# Patient Record
Sex: Female | Born: 1990
Health system: Southern US, Community
[De-identification: ages and names within clinical notes are randomized; demographics above are authoritative.]

## PROBLEM LIST (undated history)

## (undated) DIAGNOSIS — D649 Anemia, unspecified: Secondary | ICD-10-CM

## (undated) HISTORY — PX: NO PAST SURGERIES: SHX2092

---

## 2016-09-09 DIAGNOSIS — B009 Herpesviral infection, unspecified: Secondary | ICD-10-CM

## 2016-09-09 HISTORY — DX: Herpesviral infection, unspecified: B00.9

## 2017-09-09 DIAGNOSIS — G971 Other reaction to spinal and lumbar puncture: Secondary | ICD-10-CM

## 2017-09-09 HISTORY — DX: Other reaction to spinal and lumbar puncture: G97.1

## 2019-05-30 ENCOUNTER — Encounter: Payer: Self-pay | Admitting: Emergency Medicine

## 2019-05-30 ENCOUNTER — Emergency Department: Payer: HRSA Program

## 2019-05-30 ENCOUNTER — Emergency Department
Admission: EM | Admit: 2019-05-30 | Discharge: 2019-05-30 | Disposition: A | Discharge status: Home / Self Care | Payer: HRSA Program | Attending: Emergency Medicine | Admitting: Emergency Medicine

## 2019-05-30 ENCOUNTER — Other Ambulatory Visit: Payer: Self-pay

## 2019-05-30 DIAGNOSIS — Z20822 Contact with and (suspected) exposure to covid-19: Secondary | ICD-10-CM

## 2019-05-30 DIAGNOSIS — U071 COVID-19: Secondary | ICD-10-CM | POA: Insufficient documentation

## 2019-05-30 DIAGNOSIS — Z87891 Personal history of nicotine dependence: Secondary | ICD-10-CM | POA: Diagnosis not present

## 2019-05-30 DIAGNOSIS — R0602 Shortness of breath: Secondary | ICD-10-CM | POA: Diagnosis present

## 2019-05-30 MED ORDER — BENZONATATE 100 MG PO CAPS: 100.0000 mg | ORAL_CAPSULE | Freq: Three times a day (TID) | ORAL | 0 refills | Status: DC | PRN

## 2019-05-30 NOTE — ED Notes (Signed)
See triage note  presents with sore throat and diff breathing  States she was here with her daughter last Thursday    She had fever on Friday but is afebrile on arrival  States her daughter tested positive for COVID

## 2019-05-30 NOTE — ED Triage Notes (Signed)
C/O difficulty breathing, sore throat since Thursday.  Daughter tested positive for COVID on Thursday.   AAOx3.  Skin warm and dry. NAD

## 2019-05-30 NOTE — ED Provider Notes (Signed)
[facility] Provider Note  ____________________________________________  Time seen: Approximately 5:45 PM  I have reviewed the triage vital signs and the nursing notes.   HISTORY  Chief Complaint Shortness of Breath    HPI Jodi [facility] is a 28 y.o. female that presents to the emergency department for evaluation of shortness of breath, cough, fever and exposure to COVID 19.  Patient states that daughter tested positive for COVID-19.  Patient states that she heard on the news that you should go to the emergency department if you feel short of breath so she came today to be evaluated.  Patient does not smoke.  No history of allergies or asthma.  No vomiting, diarrhea.   History reviewed. No pertinent past medical history.  There are no active problems to display for this patient.   History reviewed. No pertinent surgical history.  Prior to Admission medications   Medication Sig Start Date End Date Taking? Authorizing Provider  benzonatate (TESSALON PERLES) 100 MG capsule Take 1 capsule (100 mg total) by mouth 3 (three) times daily as needed. 05/30/19 05/29/20  Laban Emperor, PA-C    Allergies Dimetapp multisymptom cold-flu [phenyleph-cpm-dm-apap]  No family history on file.  Social History Social History   Tobacco Use  . Smoking status: Former Research scientist (life sciences)  . Smokeless tobacco: Never Used  Substance Use Topics  . Alcohol use: Not on file  . Drug use: Not on file     Review of Systems  Constitutional: Positive for fever. Eyes: No visual changes. No discharge. ENT: Positive for congestion and rhinorrhea. Cardiovascular: No chest pain. Respiratory: Positive for cough and SOB. Gastrointestinal: No abdominal pain.  No nausea, no vomiting.  No diarrhea.  No constipation. Musculoskeletal: Negative for musculoskeletal pain. Skin: Negative for rash, abrasions, lacerations, ecchymosis. Neurological: Negative for  headaches.   ____________________________________________   PHYSICAL EXAM:  VITAL SIGNS: ED Triage Vitals [05/30/19 1444]  Enc Vitals Group     BP (!) 143/94     Pulse Rate 82     Resp 16     Temp 98.2 F (36.8 C)     Temp Source Oral     SpO2 100 %     Weight 200 lb (90.7 kg)     Height 5\' 4"  (1.626 m)     Head Circumference      Peak Flow      Pain Score 0     Pain Loc      Pain Edu?      Excl. in [place]?      Constitutional: Alert and oriented. Well appearing and in no acute distress. Eyes: Conjunctivae are normal. PERRL. EOMI. No discharge. Head: Atraumatic. ENT: No frontal and maxillary sinus tenderness.      Ears: Tympanic membranes pearly gray with good landmarks. No discharge.      Nose: Mild congestion/rhinnorhea.      Mouth/Throat: Mucous membranes are moist. Oropharynx non-erythematous. Tonsils not enlarged. No exudates. Uvula midline. Neck: No stridor.   Hematological/Lymphatic/Immunilogical: No cervical lymphadenopathy. Cardiovascular: Normal rate, regular rhythm.  Good peripheral circulation. Respiratory: Normal respiratory effort without tachypnea or retractions. Lungs CTAB. Good air entry to the bases with no decreased or absent breath sounds. Gastrointestinal: Bowel sounds 4 quadrants. Soft and nontender to palpation. No guarding or rigidity. No palpable masses. No distention. Musculoskeletal: Full range of motion to all extremities. No gross deformities appreciated. Neurologic:  Normal speech and language. No gross focal neurologic deficits are appreciated.  Skin:  Skin is warm,  dry and intact. No rash noted. Psychiatric: Mood and affect are normal. Speech and behavior are normal. Patient exhibits appropriate insight and judgement.   ____________________________________________   LABS (all labs ordered are listed, but only abnormal results are displayed)  Labs Reviewed  NOVEL CORONAVIRUS, NAA (HOSP ORDER, SEND-OUT TO REF LAB; TAT 18-24 HRS)    ____________________________________________  EKG   ____________________________________________  RADIOLOGY Lexine BatonI, Casimir Barcellos, personally viewed and evaluated these images (plain radiographs) as part of my medical decision making, as well as reviewing the written report by the radiologist.  Dg Chest Portable 1 View  Result Date: 05/30/2019 CLINICAL DATA:  Cough, shortness of breath, COVID-19 exposure EXAM: PORTABLE CHEST 1 VIEW COMPARISON:  None. FINDINGS: The heart size and mediastinal contours are within normal limits. Both lungs are clear. The visualized skeletal structures are unremarkable. IMPRESSION: No acute abnormality of the lungs in AP portable examination. Electronically Signed   By: Lauralyn PrimesAlex  Bibbey M.D.   On: 05/30/2019 16:28    ____________________________________________    PROCEDURES  Procedure(s) performed:    Procedures    Medications - No data to display   ____________________________________________   INITIAL IMPRESSION / ASSESSMENT AND PLAN / ED COURSE  Pertinent labs & imaging results that were available during my care of the patient were reviewed by me and considered in my medical decision making (see chart for details).  Review of the [place] CSRS was performed in accordance of the [facility] prior to dispensing any controlled drugs.   Patient's diagnosis is consistent with suspected covid infection. Vital signs and exam are reassuring. Chest xray negative for acute cardiopulmonary processes.  O2 satting at 100%.  Patient appears well and is staying well hydrated. Patient feels comfortable going home. Patient will be discharged home with prescriptions for tessalon perles. Patient is to follow up with primary care as needed or otherwise directed. Patient is given ED precautions to return to the ED for any worsening or new symptoms.  Jodi LeatherwoodKatherine Rhodes was evaluated in Emergency Department on 05/30/2019 for the symptoms described in the history of present illness.  She was evaluated in the context of the global COVID-19 pandemic, which necessitated consideration that the patient might be at risk for infection with the SARS-CoV-2 virus that causes COVID-19. Institutional protocols and algorithms that pertain to the evaluation of patients at risk for COVID-19 are in a state of rapid change based on information released by regulatory bodies including the CDC and federal and state organizations. These policies and algorithms were followed during the patient's care in the ED.   ____________________________________________  FINAL CLINICAL IMPRESSION(S) / ED DIAGNOSES  Final diagnoses:  Suspected Covid-19 Virus Infection      NEW MEDICATIONS STARTED DURING THIS VISIT:  ED Discharge Orders         Ordered    benzonatate (TESSALON PERLES) 100 MG capsule  3 times daily PRN     05/30/19 1711              This chart was dictated using voice recognition software/Dragon. Despite best efforts to proofread, errors can occur which can change the meaning. Any change was purely unintentional.    Enid DerryWagner, Kaylon Laroche, PA-C 05/30/19 1830    Minna AntisPaduchowski, Kevin, MD 05/30/19 [phone]

## 2019-06-01 ENCOUNTER — Telehealth: Payer: Self-pay | Admitting: Emergency Medicine

## 2019-06-01 LAB — NOVEL CORONAVIRUS, NAA (HOSP ORDER, SEND-OUT TO REF LAB; TAT 18-24 HRS): SARS-CoV-2, NAA: DETECTED — AB

## 2019-06-01 NOTE — Telephone Encounter (Signed)
Patient called me back and I gave her result. And gave cdc guidelines for coming out of isolation.

## 2019-06-01 NOTE — Telephone Encounter (Signed)
Called pateint to inform of positive covid 19 result. Left message with my number

## 2019-07-12 LAB — OB RESULTS CONSOLE HIV ANTIBODY (ROUTINE TESTING): HIV: NONREACTIVE

## 2019-07-12 LAB — OB RESULTS CONSOLE GC/CHLAMYDIA
Chlamydia: NEGATIVE
Gonorrhea: NEGATIVE

## 2019-07-12 LAB — OB RESULTS CONSOLE VARICELLA ZOSTER ANTIBODY, IGG: Varicella: IMMUNE

## 2019-07-12 LAB — OB RESULTS CONSOLE ABO/RH: RH Type: POSITIVE

## 2019-07-12 LAB — OB RESULTS CONSOLE HEPATITIS B SURFACE ANTIGEN: Hepatitis B Surface Ag: NEGATIVE

## 2019-07-12 LAB — OB RESULTS CONSOLE PLATELET COUNT: Platelets: 297

## 2019-07-12 LAB — OB RESULTS CONSOLE RUBELLA ANTIBODY, IGM: Rubella: IMMUNE

## 2019-07-12 LAB — OB RESULTS CONSOLE HGB/HCT, BLOOD
HCT: 37 (ref 29–41)
Hemoglobin: 11.6

## 2019-07-12 LAB — OB RESULTS CONSOLE ANTIBODY SCREEN: Antibody Screen: NEGATIVE

## 2019-07-12 LAB — OB RESULTS CONSOLE RPR: RPR: NONREACTIVE

## 2019-09-10 NOTE — L&D Delivery Note (Addendum)
       Delivery Note   [facility]odi [facility] is a 29 y.o. [id number] at [redacted]w[redacted]d Estimated Date of Delivery: 01/28/20  PRE-OPERATIVE DIAGNOSIS:  1) [redacted]w[redacted]d pregnancy.   POST-OPERATIVE DIAGNOSIS:  1) [redacted]w[redacted]d pregnancy s/p Vaginal, Spontaneous   Delivery Type: Vaginal, Spontaneous    Delivery Anesthesia: Epidural   Labor Complications:  none    ESTIMATED BLOOD LOSS: 200 ml    FINDINGS:   1) female infant, Apgar scores of 8   at 1 minute and 9   at 5 minutes and a birthweight pending infant remains skin to skin.    2) Nuchal cord: none  SPECIMENS:   PLACENTA:   Appearance: Intact , 3 vessel cord   Removal: Spontaneous      Disposition:  per protocol   DISPOSITION:  Infant to left in stable condition in the delivery room, with L&D personnel and mother,  NARRATIVE SUMMARY: Labor course:  Ms. Jodi Rhodes is a [id number] at [redacted]w[redacted]d who presented for labor management and rupture of membranes.  She progressed well in labor without pitocin.  She received the appropriate epidural anesthesia and proceeded to complete dilation. She evidenced good maternal expulsive effort during the second stage. She went on to deliver a viable female infant "Jayla" in ROT position.  The placenta delivered without problems and was noted to be complete. A perineal and vaginal examination was performed. Episiotomy/Lacerations: none. Perineal skid mark, hemostatic no need for repair. The patient tolerated this well.  Doreene Burke, CNM . 01/23/2020 9:40 AM

## 2019-09-16 ENCOUNTER — Observation Stay
Admission: EM | Admit: 2019-09-16 | Discharge: 2019-09-16 | Disposition: A | Discharge status: Home / Self Care | Payer: Medicaid Other | Attending: Obstetrics and Gynecology | Admitting: Obstetrics and Gynecology

## 2019-09-16 ENCOUNTER — Encounter: Payer: Self-pay | Admitting: Obstetrics and Gynecology

## 2019-09-16 ENCOUNTER — Other Ambulatory Visit: Payer: Self-pay

## 2019-09-16 DIAGNOSIS — W19XXXA Unspecified fall, initial encounter: Secondary | ICD-10-CM | POA: Diagnosis not present

## 2019-09-16 DIAGNOSIS — W1800XA Striking against unspecified object with subsequent fall, initial encounter: Secondary | ICD-10-CM | POA: Diagnosis present

## 2019-09-16 DIAGNOSIS — Y9389 Activity, other specified: Secondary | ICD-10-CM | POA: Diagnosis not present

## 2019-09-16 DIAGNOSIS — Z3A20 20 weeks gestation of pregnancy: Secondary | ICD-10-CM | POA: Diagnosis not present

## 2019-09-16 DIAGNOSIS — O368120 Decreased fetal movements, second trimester, not applicable or unspecified: Secondary | ICD-10-CM | POA: Diagnosis not present

## 2019-09-16 HISTORY — DX: Anemia, unspecified: D64.9

## 2019-09-16 MED ORDER — ACETAMINOPHEN-CODEINE #3 300-30 MG PO TABS: 1.0000 | ORAL_TABLET | ORAL | 0 refills | Status: DC | PRN

## 2019-09-16 NOTE — Progress Notes (Signed)
Notified of Dr. Jerene Pitch of patient arrival,G4P3 @ 20.[redacted] weeks gestation,  c/o decreased fetal movement after tripping over headboard yesterday.  FHR 159 via doppler, no contractions noted on toco tracing.  MD to come to assess patient.

## 2019-09-16 NOTE — OB Triage Note (Signed)
Patient presents to LD after a fall around 0600 09/15/2019 and tripped on the corner of her headboard.  Patient states that she hit her right leg and caught herself with her hands.  Patient reports since the fall she has had decreased fetal movement, back pain, and leg pain where she hit the bed.  Patient denies any vaginal bleeding or leaking of fluid, however she had a lot of discharge which is not new.

## 2019-09-16 NOTE — Progress Notes (Signed)
Discharge instructions reviewed with the patient, patient verbalized understanding and agreement with plan of care.  Patient transported via wheelchair by nurse tech to her car.

## 2019-09-16 NOTE — Discharge Summary (Signed)
Physician Discharge Summary   Patient ID: Jodi Rhodes [id number] 28 y.o. [birth year]  Admit date: 09/16/2019  Discharge date and time: No discharge date for patient encounter.   Admitting Physician: Homero Fellers, MD   Discharge Physician: Adrian Prows M  Admission Diagnoses: Fall against object [W18.00XA]  Discharge Diagnoses: Fall against object, [redacted] week gestation  Admission Condition: good  Discharged Condition: good  Indication for Admission: Evaluation after fall  Hospital Course: Patient presents to labor and delivery after a fall.  She reports the fall occurred yesterday.  She hit her leg and fell over her head board.  She says that she did not hit her abdomen.  She braced herself with her hands and her knees.  She reports that today she has had less fetal movement.  She denies any leakage of fluid or vaginal bleeding.  Since she has been here she started to feel a little bit of fetal movement again fetal heart tones were in the 150s.  She is not having any contractions she complains of back pain which has been present since she last was pregnant and last had an epidural she says that her epidural was uncomplicated placement and she had a spinal headache afterwards.  She would like some medicine to help with her back pain and her leg pain she is getting prenatal care at [facility] she reports that her next prenatal care visit is in 1 week.  Will discharge home with precautions instructions to return if any vaginal bleeding contractions or decreased fetal movement.  Given a prescription for Tylenol 3.  Consults: None  Significant Diagnostic Studies: labs: none  Treatments: IV hydration  Discharge Exam: Temp 98.2 F (36.8 C) (Oral)   Ht 5\' 4"  (1.626 m)   Wt 88.9 kg   LMP 04/23/2019   BMI 33.64 kg/m   General Appearance:    Alert, cooperative, no distress, appears stated age  Head:    Normocephalic, without obvious abnormality, atraumatic  Eyes:    PERRL,  conjunctiva/corneas clear, EOM's intact, fundi    benign, both eyes  Ears:    Normal TM's and external ear canals, both ears  Nose:   Nares normal, septum midline, mucosa normal, no drainage    or sinus tenderness  Throat:   Lips, mucosa, and tongue normal; teeth and gums normal  Neck:   Supple, symmetrical, trachea midline, no adenopathy;    thyroid:  no enlargement/tenderness/nodules; no carotid   bruit or JVD  Back:     Symmetric, no curvature, ROM normal, no CVA tenderness  Lungs:     Clear to auscultation bilaterally, respirations unlabored  Chest Wall:    No tenderness or deformity   Heart:    Regular rate and rhythm, S1 and S2 normal, no murmur, rub   or gallop  Breast Exam:    No tenderness, masses, or nipple abnormality  Abdomen:     Soft, non-tender, bowel sounds active all four quadrants,    no masses, no organomegaly  Genitalia:    Normal female without lesion, discharge or tenderness  Rectal:    Normal tone, normal prostate, no masses or tenderness;   guaiac negative stool  Extremities:   Extremities normal, atraumatic, no cyanosis or edema  Pulses:   2+ and symmetric all extremities  Skin:   Skin color, texture, turgor normal, no rashes or lesions  Lymph nodes:   Cervical, supraclavicular, and axillary nodes normal  Neurologic:   CNII-XII intact, normal strength, sensation and reflexes  throughout    Disposition: Discharge disposition: 01-Home or Self Care     Patient Instructions:  Allergies as of 09/16/2019      Reactions   Dimetapp Multisymptom Cold-flu [phenyleph-cpm-dm-apap]       Medication List    TAKE these medications   acetaminophen-codeine 300-30 MG tablet Commonly known as: TYLENOL #3 Take 1 tablet by mouth every 4 (four) hours as needed for moderate pain.   prenatal multivitamin Tabs tablet Take 1 tablet by mouth daily at 12 noon.      Activity: activity as tolerated Diet: regular diet Wound Care: none needed  Follow-up with [facility] in 1  week.  Signed: Natale Milch 09/16/2019 9:47 PM

## 2019-11-17 ENCOUNTER — Telehealth: Payer: Self-pay | Admitting: Certified Nurse Midwife

## 2019-11-17 NOTE — Telephone Encounter (Signed)
Jodi [facility], She will need to be scheduled in a 30 minute slot or if you can find (2) 15 minute slot together.  Thanks. Park Meo

## 2019-11-17 NOTE — Telephone Encounter (Signed)
Patient called wanting to trasnfer her care from [facility] family medicine in [place] to [facility]. She is 7 months pregnant EDD May 21st. She would like to see a midwife. How would you like to schedule her, and if michelle cant see this patient would annie be able to? I stressed to the patient about getting her medical records faxed over to [place].

## 2019-12-03 ENCOUNTER — Encounter: Payer: Self-pay | Admitting: Certified Nurse Midwife

## 2019-12-03 ENCOUNTER — Other Ambulatory Visit: Payer: Self-pay

## 2019-12-03 ENCOUNTER — Ambulatory Visit (INDEPENDENT_AMBULATORY_CARE_PROVIDER_SITE_OTHER): Payer: Medicaid Other | Admitting: Certified Nurse Midwife

## 2019-12-03 VITALS — BP 124/70 | HR 80 | Wt 196.5 lb

## 2019-12-03 DIAGNOSIS — O99210 Obesity complicating pregnancy, unspecified trimester: Secondary | ICD-10-CM

## 2019-12-03 DIAGNOSIS — O3503X0 Maternal care for (suspected) central nervous system malformation or damage in fetus, choroid plexus cysts, not applicable or unspecified: Secondary | ICD-10-CM

## 2019-12-03 DIAGNOSIS — O350XX0 Maternal care for (suspected) central nervous system malformation in fetus, not applicable or unspecified: Secondary | ICD-10-CM

## 2019-12-03 DIAGNOSIS — Z3493 Encounter for supervision of normal pregnancy, unspecified, third trimester: Secondary | ICD-10-CM | POA: Diagnosis not present

## 2019-12-03 DIAGNOSIS — Z3A32 32 weeks gestation of pregnancy: Secondary | ICD-10-CM | POA: Diagnosis not present

## 2019-12-03 DIAGNOSIS — O0933 Supervision of pregnancy with insufficient antenatal care, third trimester: Secondary | ICD-10-CM

## 2019-12-03 NOTE — Patient Instructions (Signed)
Fetal Movement Counts Patient Name: ________________________________________________ Patient Due Date: ____________________ What is a fetal movement count?  A fetal movement count is the number of times that you feel your baby move during a certain amount of time. This may also be called a fetal kick count. A fetal movement count is recommended for every pregnant woman. You may be asked to start counting fetal movements as early as week 28 of your pregnancy. Pay attention to when your baby is most active. You may notice your baby's sleep and wake cycles. You may also notice things that make your baby move more. You should do a fetal movement count:  When your baby is normally most active.  At the same time each day. A good time to count movements is while you are resting, after having something to eat and drink. How do I count fetal movements? 1. Find a quiet, comfortable area. Sit, or lie down on your side. 2. Write down the date, the start time and stop time, and the number of movements that you felt between those two times. Take this information with you to your health care visits. 3. Write down your start time when you feel the first movement. 4. Count kicks, flutters, swishes, rolls, and jabs. You should feel at least 10 movements. 5. You may stop counting after you have felt 10 movements, or if you have been counting for 2 hours. Write down the stop time. 6. If you do not feel 10 movements in 2 hours, contact your health care provider for further instructions. Your health care provider may want to do additional tests to assess your baby's well-being. Contact a health care provider if:  You feel fewer than 10 movements in 2 hours.  Your baby is not moving like he or she usually does. Date: ____________ Start time: ____________ Stop time: ____________ Movements: ____________ Date: ____________ Start time: ____________ Stop time: ____________ Movements: ____________ Date: ____________  Start time: ____________ Stop time: ____________ Movements: ____________ Date: ____________ Start time: ____________ Stop time: ____________ Movements: ____________ Date: ____________ Start time: ____________ Stop time: ____________ Movements: ____________ Date: ____________ Start time: ____________ Stop time: ____________ Movements: ____________ Date: ____________ Start time: ____________ Stop time: ____________ Movements: ____________ Date: ____________ Start time: ____________ Stop time: ____________ Movements: ____________ Date: ____________ Start time: ____________ Stop time: ____________ Movements: ____________ This information is not intended to replace advice given to you by your health care provider. Make sure you discuss any questions you have with your health care provider. Document Revised: 04/15/2019 Document Reviewed: 04/15/2019 Elsevier Patient Education  [place] of Pregnancy  The third trimester is from week 28 through week 40 (months 7 through 9). This trimester is when your unborn baby (fetus) is growing very fast. At the end of the ninth month, the unborn baby is about 20 inches in length. It weighs about 6-10 pounds. Follow these instructions at home: Medicines  Take over-the-counter and prescription medicines only as told by your doctor. Some medicines are safe and some medicines are not safe during pregnancy.  Take a prenatal vitamin that contains at least 600 micrograms (mcg) of folic acid.  If you have trouble pooping (constipation), take medicine that will make your stool soft (stool softener) if your doctor approves. Eating and drinking   Eat regular, healthy meals.  Avoid raw meat and uncooked cheese.  If you get low calcium from the food you eat, talk to your doctor about taking a daily calcium supplement.  Eat four or five small meals rather than three large meals a day.  Avoid foods that are high in fat and sugars, such as  fried and sweet foods.  To prevent constipation: ? Eat foods that are high in fiber, like fresh fruits and vegetables, whole grains, and beans. ? Drink enough fluids to keep your pee (urine) clear or pale yellow. Activity  Exercise only as told by your doctor. Stop exercising if you start to have cramps.  Avoid heavy lifting, wear low heels, and sit up straight.  Do not exercise if it is too hot, too humid, or if you are in a place of great height (high altitude).  You may continue to have sex unless your doctor tells you not to. Relieving pain and discomfort  Wear a good support bra if your breasts are tender.  Take frequent breaks and rest with your legs raised if you have leg cramps or low back pain.  Take warm water baths (sitz baths) to soothe pain or discomfort caused by hemorrhoids. Use hemorrhoid cream if your doctor approves.  If you develop puffy, bulging veins (varicose veins) in your legs: ? Wear support hose or compression stockings as told by your doctor. ? Raise (elevate) your feet for 15 minutes, 3-4 times a day. ? Limit salt in your food. Safety  Wear your seat belt when driving.  Make a list of emergency phone numbers, including numbers for family, friends, the hospital, and police and fire departments. Preparing for your baby's arrival To prepare for the arrival of your baby:  Take prenatal classes.  Practice driving to the hospital.  Visit the hospital and tour the maternity area.  Talk to your work about taking leave once the baby comes.  Pack your hospital bag.  Prepare the baby's room.  Go to your doctor visits.  Buy a rear-facing car seat. Learn how to install it in your car. General instructions  Do not use hot tubs, steam rooms, or saunas.  Do not use any products that contain nicotine or tobacco, such as cigarettes and e-cigarettes. If you need help quitting, ask your doctor.  Do not drink alcohol.  Do not douche or use tampons or  scented sanitary pads.  Do not cross your legs for long periods of time.  Do not travel for long distances unless you must. Only do so if your doctor says it is okay.  Visit your dentist if you have not gone during your pregnancy. Use a soft toothbrush to brush your teeth. Be gentle when you floss.  Avoid cat litter boxes and soil used by cats. These carry germs that can cause birth defects in the baby and can cause a loss of your baby (miscarriage) or stillbirth.  Keep all your prenatal visits as told by your doctor. This is important. Contact a doctor if:  You are not sure if you are in labor or if your water has broken.  You are dizzy.  You have mild cramps or pressure in your lower belly.  You have a nagging pain in your belly area.  You continue to feel sick to your stomach, you throw up, or you have watery poop.  You have bad smelling fluid coming from your vagina.  You have pain when you pee. Get help right away if:  You have a fever.  You are leaking fluid from your vagina.  You are spotting or bleeding from your vagina.  You have severe belly cramps or pain.  You   lose or gain weight quickly.  You have trouble catching your breath and have chest pain.  You notice sudden or extreme puffiness (swelling) of your face, hands, ankles, feet, or legs.  You have not felt the baby move in over an hour.  You have severe headaches that do not go away with medicine.  You have trouble seeing.  You are leaking, or you are having a gush of fluid, from your vagina before you are 37 weeks.  You have regular belly spasms (contractions) before you are 37 weeks. Summary  The third trimester is from week 28 through week 40 (months 7 through 9). This time is when your unborn baby is growing very fast.  Follow your doctor's advice about medicine, food, and activity.  Get ready for the arrival of your baby by taking prenatal classes, getting all the baby items ready,  preparing the baby's room, and visiting your doctor to be checked.  Get help right away if you are bleeding from your vagina, or you have chest pain and trouble catching your breath, or if you have not felt your baby move in over an hour. This information is not intended to replace advice given to you by your health care provider. Make sure you discuss any questions you have with your health care provider. Document Revised: 12/17/2018 Document Reviewed: 10/01/2016 Elsevier Patient Education  2020 Elsevier Inc.  

## 2019-12-03 NOTE — Progress Notes (Signed)
TRANSFER IN OB HISTORY AND PHYSICAL  SUBJECTIVE:       Jodi Rhodes is a 29 y.o. [phone] female, Patient's last menstrual period was 04/23/2019., Estimated Date of Delivery: 01/28/20, [redacted]w[redacted]d, presents today for Transition of Prenatal Care. EPIC data migration from outside records is accomplished today.  Considering waterbirth due to difficulty with epidural last pregnancy.   Transferring care to local office since lives in [place] area.   Denies difficulty breathing or respiratory distress, chest pain, abdominal pain, vaginal bleeding, dysuria, and leg pain or swelling.   Gynecologic History  Patient's last menstrual period was 04/23/2019.   Contraception: none  Last Pap: 07/2019. Results were: Neg/Neg  Obstetric History  OB History  Gravida Para Term Preterm AB Living  4 3 3     2   SAB TAB Ectopic Multiple Live Births          2    # Outcome Date GA Lbr Len/2nd Weight Sex Delivery Anes PTL Lv  4 Current           3 Term 07/17/18   7 lb 6 oz (3.345 kg) F Vag-Spont  N   2 Term 06/12/16   7 lb 6 oz (3.345 kg) F Vag-Spont  N LIV  1 Term 07/06/12   6 lb 4 oz (2.835 kg) F Vag-Spont  N LIV    Past Medical History:  Diagnosis Date  . Anemia   . Herpes 2018  . 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 NO PAST SURGERIES      Current Outpatient Medications on File Prior to Visit  Medication Sig Dispense Refill  . Prenatal Vit-Fe Fumarate-FA (PRENATAL MULTIVITAMIN) TABS tablet Take 1 tablet by mouth daily at 12 noon.    2020 acetaminophen-codeine (TYLENOL #3) 300-30 MG tablet Take 1 tablet by mouth every 4 (four) hours as needed for moderate pain. (Patient not taking: Reported on 12/03/2019) 5 tablet 0   No current facility-administered medications on file prior to visit.    Allergies  Allergen Reactions  . Dimetapp Multisymptom Cold-Flu [Phenyleph-Cpm-Dm-Apap]     Social History   Socioeconomic  History  . Marital status: 12/05/2019    Spouse name: Not on file  . Number of children: Not on file  . Years of education: Not on file  . Highest education level: Not on file  Occupational History  . Not on file  Tobacco Use  . Smoking status: Former Smoker    Types: Cigarettes  . Smokeless tobacco: Never Used  Substance and Sexual Activity  . Alcohol use: Not Currently  . Drug use: Not Currently  . Sexual activity: Yes    Birth control/protection: None  Other Topics Concern  . Not on file  Social History Narrative  . Not on file   Social Determinants of Health   Financial Resource Strain:   . Difficulty of Paying Living Expenses:   Food Insecurity:   . Worried About Media planner in the Last Year:   . Programme researcher, broadcasting/film/video in the Last Year:   Transportation Needs:   . Barista (Medical):   Freight forwarder Lack of Transportation (Non-Medical):   Physical Activity:   . Days of Exercise per Week:   . Minutes of Exercise per Session:   Stress:   . Feeling of Stress :   Social Connections:   . Frequency of Communication with Friends and Family:   .  Frequency of Social Gatherings with Friends and Family:   . Attends Religious Services:   . Active Member of Clubs or Organizations:   . Attends Archivist Meetings:   Marland Kitchen Marital Status:   Intimate Partner Violence:   . Fear of Current or Ex-Partner:   . Emotionally Abused:   Marland Kitchen Physically Abused:   . Sexually Abused:     History reviewed. No pertinent family history.  The following portions of the patient's history were reviewed and updated as appropriate: allergies, current medications, past OB history, past medical history, past surgical history, past family history, past social history, and problem list.  Review of Systems:  ROS negative except as noted above. Information obtained from patient.   OBJECTIVE:  BP 124/70   Pulse 80   Wt 196 lb 8 oz (89.1 kg)   LMP 04/23/2019   BMI 33.73 kg/m    Initial Physical Exam (New OB)  GENERAL APPEARANCE: alert, well appearing, in no apparent distress  HEAD: normocephalic, atraumatic  MOUTH: mucous membranes moist, pharynx normal without lesions  THYROID: no thyromegaly or masses present  BREASTS: not examined  LUNGS: clear to auscultation, no wheezes, rales or rhonchi, symmetric air entry  HEART: regular rate and rhythm, no murmurs  ABDOMEN: soft, nontender, nondistended, no abnormal masses, no epigastric pain, fundus soft, nontender 32 cm and FHT present  EXTREMITIES: no redness or tenderness in the calves or thighs, no edema  SKIN: normal coloration and turgor, no rashes  LYMPH NODES: no adenopathy palpable  NEUROLOGIC: alert, oriented, normal speech, no focal findings or movement disorder noted  PELVIC EXAM: not examined  ASSESSMENT: Normal pregnancy Transfer of care at 32 weeks, limited care-PMH completed Obesity in pregnancy History of anemia-iron samples provided Choroid plexus cyst on fetal ultrasound  PLAN: Prenatal care New OB counseling: The patient has been given an overview regarding routine prenatal care. Recommendations regarding diet, weight gain, and exercise in pregnancy were given. Prenatal testing, optional genetic testing, and ultrasound use in pregnancy were reviewed.  Benefits of Breast Feeding were discussed. The patient is encouraged to consider nursing her baby post partum. See orders   Diona Fanti, CNM [facility], [facility] 12/03/19 5:16 PM

## 2019-12-15 ENCOUNTER — Other Ambulatory Visit: Payer: Medicaid Other

## 2019-12-15 ENCOUNTER — Ambulatory Visit (INDEPENDENT_AMBULATORY_CARE_PROVIDER_SITE_OTHER): Payer: Medicaid Other

## 2019-12-15 ENCOUNTER — Other Ambulatory Visit: Payer: Self-pay

## 2019-12-15 ENCOUNTER — Encounter: Payer: Self-pay | Admitting: Certified Nurse Midwife

## 2019-12-15 ENCOUNTER — Ambulatory Visit (INDEPENDENT_AMBULATORY_CARE_PROVIDER_SITE_OTHER): Payer: Medicaid Other | Admitting: Certified Nurse Midwife

## 2019-12-15 VITALS — BP 103/69 | HR 94 | Wt 197.2 lb

## 2019-12-15 DIAGNOSIS — Z23 Encounter for immunization: Secondary | ICD-10-CM

## 2019-12-15 DIAGNOSIS — Z3493 Encounter for supervision of normal pregnancy, unspecified, third trimester: Secondary | ICD-10-CM | POA: Diagnosis not present

## 2019-12-15 DIAGNOSIS — O3503X0 Maternal care for (suspected) central nervous system malformation or damage in fetus, choroid plexus cysts, not applicable or unspecified: Secondary | ICD-10-CM

## 2019-12-15 DIAGNOSIS — Z3A32 32 weeks gestation of pregnancy: Secondary | ICD-10-CM | POA: Diagnosis not present

## 2019-12-15 DIAGNOSIS — O350XX0 Maternal care for (suspected) central nervous system malformation in fetus, not applicable or unspecified: Secondary | ICD-10-CM | POA: Diagnosis not present

## 2019-12-15 LAB — POCT URINALYSIS DIPSTICK OB
Bilirubin, UA: NEGATIVE
Blood, UA: NEGATIVE
Glucose, UA: NEGATIVE
Ketones, UA: NEGATIVE
Leukocytes, UA: NEGATIVE
Nitrite, UA: NEGATIVE
POC,PROTEIN,UA: NEGATIVE
Spec Grav, UA: 1.015 (ref 1.010–1.025)
Urobilinogen, UA: 0.2 E.U./dL
pH, UA: 5 (ref 5.0–8.0)

## 2019-12-15 MED ORDER — TETANUS-DIPHTH-ACELL PERTUSSIS 5-2.5-18.5 LF-MCG/0.5 IM SUSP
0.5000 mL | Freq: Once | INTRAMUSCULAR | Status: AC
  Administered 2019-12-15: 0.5 mL via INTRAMUSCULAR

## 2019-12-15 NOTE — Patient Instructions (Signed)
Group B Streptococcus Infection During Pregnancy °Group B Streptococcus (GBS) is a type of bacteria that is often found in healthy people. It is commonly found in the rectum, vagina, and intestines. In people who are healthy and not pregnant, the bacteria rarely cause serious illness or complications. However, women who test positive for GBS during pregnancy can pass the bacteria to the baby during childbirth. This can cause serious infection in the baby after birth. °Women with GBS may also have infections during their pregnancy or soon after childbirth. The infections include urinary tract infections (UTIs) or infections of the uterus. GBS also increases a woman's risk of complications during pregnancy, such as early labor or delivery, miscarriage, or stillbirth. Routine testing for GBS is recommended for all pregnant women. °What are the causes? °This condition is caused by bacteria called Streptococcus agalactiae. °What increases the risk? °You may have a higher risk for GBS infection during pregnancy if you had one during a past pregnancy. °What are the signs or symptoms? °In most cases, GBS infection does not cause symptoms in pregnant women. If symptoms exist, they may include: °· Labor that starts before the 37th week of pregnancy. °· A UTI or bladder infection. This may cause a fever, frequent urination, or pain and burning during urination. °· Fever during labor. There can also be a rapid heartbeat in the mother or baby. °Rare but serious symptoms of a GBS infection in women include: °· Blood infection (septicemia). This may cause fever, chills, or confusion. °· Lung infection (pneumonia). This may cause fever, chills, cough, rapid breathing, chest pain, or difficulty breathing. °· Bone, joint, skin, or soft tissue infection. °How is this diagnosed? °You may be screened for GBS between week 35 and week 37 of pregnancy. If you have symptoms of preterm labor, you may be screened earlier. This condition is  diagnosed based on lab test results from: °· A swab of fluid from the vagina and rectum. °· A urine sample. °How is this treated? °This condition is treated with antibiotic medicine. Antibiotic medicine may be given: °· To you when you go into labor, or as soon as your water breaks. The medicines will continue until after you give birth. If you are having a cesarean delivery, you do not need antibiotics unless your water has broken. °· To your baby, if he or she requires treatment. Your health care provider will check your baby to decide if he or she needs antibiotics to prevent a serious infection. °Follow these instructions at home: °· Take over-the-counter and prescription medicines only as told by your health care provider. °· Take your antibiotic medicine as told by your health care provider. Do not stop taking the antibiotic even if you start to feel better. °· Keep all pre-birth (prenatal) visits and follow-up visits as told by your health care provider. This is important. °Contact a health care provider if: °· You have pain or burning when you urinate. °· You have to urinate more often than usual. °· You have a fever or chills. °· You develop a bad-smelling vaginal discharge. °Get help right away if: °· Your water breaks. °· You go into labor. °· You have severe pain in your abdomen. °· You have difficulty breathing. °· You have chest pain. °These symptoms may represent a serious problem that is an emergency. Do not wait to see if the symptoms will go away. Get medical help right away. Call your local emergency services (911 in the [place] Do not drive yourself to   the hospital. °Summary °· GBS is a type of bacteria that is common in healthy people. °· During pregnancy, colonization with GBS can cause serious complications for you or your baby. °· Your health care provider will screen you between 35 and 37 weeks of pregnancy to determine if you are colonized with GBS. °· If you are colonized with GBS during  pregnancy, your health care provider will recommend antibiotics through an IV during labor. °· After delivery, your baby will be evaluated for complications related to potential GBS infection and may require antibiotics to prevent a serious infection. °This information is not intended to replace advice given to you by your health care provider. Make sure you discuss any questions you have with your health care provider. °Document Revised: 03/22/2019 Document Reviewed: 03/22/2019 °Elsevier Patient Education © 2020 [employer] Inc. ° °

## 2019-12-15 NOTE — Progress Notes (Signed)
ROB and u/s today for growth /AFI, transfer [facility]. (see below for result). Results reviewed. Sample birth plan given Tdap/RPR/CBC/Glucose /BTC today. Pt had bad experience with epidural last delivery and is planning to do it natural if possible. reviewed u/s results and reviewed RSB, see check list for topics reviewed. Pt has history of HSV-discussed valtrex daily at 36 wks. Will order next visit.  Follow up 2 wks with [name].   Patient Name: Jodi Rhodes DOB: 25-Mar-1991 MRN: [id number] ULTRASOUND REPORT  Location: [facility] Date of Service: 12/15/2019   Indications:growth/afi Findings:  Jodi Rhodes intrauterine pregnancy is visualized with FHR at 139 BPM. Biometrics give an (U/S) Gestational age of [redacted]w[redacted]d and an (U/S) EDD of 01/30/2020; this correlates with the clinically established Estimated Date of Delivery: 01/28/20.  Fetal presentation is Cephalic.  Placenta: fundal. Grade: 2 AFI: 9.5 cm  Growth percentile is 40. EFW: 2186 g ( 4 lbs 11 oz)   Impression: 1. [redacted]w[redacted]d Viable Singleton Intrauterine pregnancy previously established criteria. 2. Growth is 40 %ile.  AFI is 9.5 cm.  3. Choroid plexus cyst were not seen at this time.  Recommendations: 1.Clinical correlation with the patient's History and Physical Exam.   Jenine  M. Marciano Sequin     RDMS

## 2019-12-16 ENCOUNTER — Other Ambulatory Visit: Payer: Self-pay | Admitting: Certified Nurse Midwife

## 2019-12-16 ENCOUNTER — Telehealth: Payer: Self-pay

## 2019-12-16 LAB — GLUCOSE, 1 HOUR GESTATIONAL: Gestational Diabetes Screen: 118 mg/dL (ref 65–139)

## 2019-12-16 LAB — CBC
Hematocrit: 30.5 % — ABNORMAL LOW (ref 34.0–46.6)
Hemoglobin: 9.4 g/dL — ABNORMAL LOW (ref 11.1–15.9)
MCH: 22.2 pg — ABNORMAL LOW (ref 26.6–33.0)
MCHC: 30.8 g/dL — ABNORMAL LOW (ref 31.5–35.7)
MCV: 72 fL — ABNORMAL LOW (ref 79–97)
Platelets: 173 10*3/uL (ref 150–450)
RBC: 4.23 x10E6/uL (ref 3.77–5.28)
RDW: 15.6 % — ABNORMAL HIGH (ref 11.7–15.4)
WBC: 6.5 10*3/uL (ref 3.4–10.8)

## 2019-12-16 LAB — RPR: RPR Ser Ql: NONREACTIVE

## 2019-12-16 NOTE — Telephone Encounter (Signed)
Voicemail message left for patient requesting a call back to me.

## 2019-12-17 ENCOUNTER — Telehealth: Payer: Self-pay

## 2019-12-17 NOTE — Telephone Encounter (Signed)
Second call placed to patient- message left to please return my call.

## 2019-12-21 ENCOUNTER — Telehealth: Payer: Self-pay | Admitting: Certified Nurse Midwife

## 2019-12-21 ENCOUNTER — Telehealth: Payer: Self-pay

## 2019-12-21 NOTE — Telephone Encounter (Signed)
She can take over the counter iron 2 times a day.  Do we have any of that farrolet sample we can give her to see if she can tolerate that. Thanks Pattricia Boss

## 2019-12-21 NOTE — Telephone Encounter (Signed)
Pt called in and stated that she was returning your call. The pt did change her number. The pt is requesting a call back. Please advise [phone]

## 2019-12-21 NOTE — Telephone Encounter (Addendum)
The pt stated that she started taking the fusion plus, the next day after her last visit. She stated that she doesn't take it as much because it upsets her stomach.  Please advise

## 2019-12-21 NOTE — Telephone Encounter (Signed)
Message left again for the patient- Is she taking fusion plus and if so how long has she been taking it? Requested a call back.

## 2019-12-21 NOTE — Telephone Encounter (Signed)
-----   Message from Doreene Burke, [place] sent at 12/16/2019 11:48 AM EDT ----- Jasmine December please let Jaxsyn know that her glucose screen was normal but her blood count was low. I see that she has the fusion plus ordered can you see if she is taking it? If not she needs to take it daily. If she is how long has she been taking it. May need to draw Vitamin B 12 and iron level at her next visit.   Thanks,  Pattricia Boss ----- Message ----- From: Yevonne Aline, LPN Sent: 04/14/8256   2:58 PM EDT To: Doreene Burke, CNM

## 2019-12-21 NOTE — Telephone Encounter (Signed)
Called patient- samples of Ferralet 90 are awaiting her pickup to try and to see if she tolerates them. Patient agrees and will pickup tomorrow 12/22/19.

## 2019-12-23 ENCOUNTER — Telehealth: Payer: Self-pay

## 2019-12-23 ENCOUNTER — Telehealth: Payer: Self-pay | Admitting: Certified Nurse Midwife

## 2019-12-23 NOTE — Telephone Encounter (Signed)
Pt called in and stated that she is 34 weeks and she is having issues with her allergies and is requesting a call about what she can take or can something be sent to her pharmacy [facility] [address]. Please advise

## 2019-12-23 NOTE — Telephone Encounter (Signed)
She has tried Zyrtec or Claritin as well. What does she desire for management of her symptoms? Also, she's sure no COVID exposures? JML

## 2019-12-23 NOTE — Telephone Encounter (Signed)
Returned patients call- she states she has tried everything over the counter for her symptoms which are- dizziness, SOB,sore throat,body aches and cough. She states its from all the pollen.

## 2019-12-24 ENCOUNTER — Encounter: Payer: Self-pay | Admitting: Emergency Medicine

## 2019-12-24 ENCOUNTER — Telehealth: Payer: Self-pay

## 2019-12-24 ENCOUNTER — Observation Stay
Admission: EM | Admit: 2019-12-24 | Discharge: 2019-12-24 | Disposition: A | Discharge status: Home / Self Care | Payer: Medicaid Other | Attending: Certified Nurse Midwife | Admitting: Certified Nurse Midwife

## 2019-12-24 ENCOUNTER — Other Ambulatory Visit: Payer: Self-pay

## 2019-12-24 DIAGNOSIS — M549 Dorsalgia, unspecified: Secondary | ICD-10-CM | POA: Insufficient documentation

## 2019-12-24 DIAGNOSIS — O26893 Other specified pregnancy related conditions, third trimester: Principal | ICD-10-CM | POA: Insufficient documentation

## 2019-12-24 DIAGNOSIS — Z3A35 35 weeks gestation of pregnancy: Secondary | ICD-10-CM | POA: Insufficient documentation

## 2019-12-24 LAB — URINALYSIS, ROUTINE W REFLEX MICROSCOPIC
Bilirubin Urine: NEGATIVE
Glucose, UA: NEGATIVE mg/dL
Hgb urine dipstick: NEGATIVE
Ketones, ur: NEGATIVE mg/dL
Leukocytes,Ua: NEGATIVE
Nitrite: NEGATIVE
Protein, ur: NEGATIVE mg/dL
Specific Gravity, Urine: 1.013 (ref 1.005–1.030)
pH: 7 (ref 5.0–8.0)

## 2019-12-24 MED ORDER — LORATADINE 10 MG PO TABS: 10.0000 mg | ORAL_TABLET | Freq: Every day | ORAL | 0 refills | Status: AC | PRN

## 2019-12-24 MED ORDER — LORATADINE 10 MG PO TABS
10.0000 mg | ORAL_TABLET | Freq: Every day | ORAL | Status: DC
  Administered 2019-12-24: 15:00:00 10 mg via ORAL
  Filled 2019-12-24: qty 1

## 2019-12-24 MED ORDER — ACETAMINOPHEN 500 MG PO TABS: 1000.0000 mg | ORAL_TABLET | Freq: Once | ORAL | Status: AC

## 2019-12-24 MED ORDER — ACETAMINOPHEN 500 MG PO TABS
ORAL_TABLET | ORAL | Status: AC
  Administered 2019-12-24: 15:00:00 1000 mg via ORAL
  Filled 2019-12-24: qty 2

## 2019-12-24 NOTE — OB Triage Note (Signed)
    L&D OB Triage Note  SUBJECTIVE Jodi Rhodes is a 29 y.o. [id number] female at [redacted]w[redacted]d, EDD Estimated Date of Delivery: 01/28/20 who presented to triage with complaints of allergie symptoms and back pain.    OB History  Gravida Para Term Preterm AB Living  4 3 3  0 0 2  SAB TAB Ectopic Multiple Live Births  0 0 0 0 2    # Outcome Date GA Lbr Len/2nd Weight Sex Delivery Anes PTL Lv  4 Current           3 Term 07/17/18   3345 g F Vag-Spont  N   2 Term 06/12/16   3345 g F Vag-Spont  N LIV  1 Term 07/06/12   2835 g F Vag-Spont  N LIV    Medications Prior to Admission  Medication Sig Dispense Refill Last Dose  . Iron-FA-B Cmp-C-Biot-Probiotic (FUSION PLUS PO) Take by mouth.   Past Week at Unknown time  . Prenatal Vit-Fe Fumarate-FA (PRENATAL MULTIVITAMIN) TABS tablet Take 1 tablet by mouth daily at 12 noon.   12/23/2019 at Unknown time  . acetaminophen-codeine (TYLENOL #3) 300-30 MG tablet Take 1 tablet by mouth every 4 (four) hours as needed for moderate pain. (Patient not taking: Reported on 12/03/2019) 5 tablet 0      OBJECTIVE  Nursing Evaluation:   BP 106/85 (BP Location: Left Arm)   Pulse 100   Temp 97.7 F (36.5 C) (Oral)   Resp 18   Ht 5\' 4"  (1.626 m)   Wt 89.4 kg   LMP 04/23/2019   SpO2 99%   BMI 33.81 kg/m    Findings:        Reactive NST, No contractions      NST was performed and has been reviewed by me.  NST INTERPRETATION: Category I  Mode: External Baseline Rate (A): 140 bpm Variability: Moderate Accelerations: None Decelerations: None     Contraction Frequency (min): none  ASSESSMENT Impression:  1.  Pregnancy:  at [redacted]w[redacted]d , EDD Estimated Date of Delivery: 01/28/20 2.  Reassuring fetal and maternal status 3.  Allergies and back pain in pregnancy   PLAN 1. Discussed current condition and above findings with patient and reassurance given.  All questions answered. Manage allergies symptoms with over the counter medications such as Claritin  or zyrtec, benadryl as needed. Heat, ice, or tylenol for back pain as needed.  2. Discharge home with standard labor precautions given to return to L&D or call the office for problems. 3. Continue routine prenatal care.  [redacted]w[redacted]d, CNM

## 2019-12-24 NOTE — ED Triage Notes (Signed)
Pt reports is [redacted] weeks pregnant with 4th pregnancy and is having sinus pressure and pain and cough and nasal congestion. Pt also reports some lower back pain and abd pain.

## 2019-12-24 NOTE — Telephone Encounter (Signed)
Patient reported to ED.

## 2019-12-24 NOTE — ED Triage Notes (Signed)
Spoke with LDR, advised it was ok to send the pt up to L & D.

## 2019-12-30 ENCOUNTER — Other Ambulatory Visit: Payer: Self-pay

## 2019-12-30 ENCOUNTER — Ambulatory Visit (INDEPENDENT_AMBULATORY_CARE_PROVIDER_SITE_OTHER): Payer: Medicaid Other | Admitting: Certified Nurse Midwife

## 2019-12-30 VITALS — BP 113/65 | HR 76 | Wt 202.4 lb

## 2019-12-30 DIAGNOSIS — Z3493 Encounter for supervision of normal pregnancy, unspecified, third trimester: Secondary | ICD-10-CM

## 2019-12-30 DIAGNOSIS — O99210 Obesity complicating pregnancy, unspecified trimester: Secondary | ICD-10-CM

## 2019-12-30 DIAGNOSIS — Z3A35 35 weeks gestation of pregnancy: Secondary | ICD-10-CM

## 2019-12-30 LAB — POCT URINALYSIS DIPSTICK OB
Bilirubin, UA: NEGATIVE
Blood, UA: NEGATIVE
Glucose, UA: NEGATIVE
Ketones, UA: NEGATIVE
Leukocytes, UA: NEGATIVE
Nitrite, UA: NEGATIVE
POC,PROTEIN,UA: NEGATIVE
Spec Grav, UA: 1.02 (ref 1.010–1.025)
Urobilinogen, UA: 0.2 E.U./dL
pH, UA: 5 (ref 5.0–8.0)

## 2019-12-30 LAB — OB RESULTS CONSOLE GC/CHLAMYDIA: Gonorrhea: NEGATIVE

## 2019-12-30 MED ORDER — VALACYCLOVIR HCL 500 MG PO TABS: 500.0000 mg | ORAL_TABLET | Freq: Two times a day (BID) | ORAL | 6 refills | Status: DC

## 2019-12-30 NOTE — Patient Instructions (Signed)
Valacyclovir caplets What is this medicine? VALACYCLOVIR (val ay SYE kloe veer) is an antiviral medicine. It is used to treat or prevent infections caused by certain kinds of viruses. Examples of these infections include herpes and shingles. This medicine will not cure herpes. This medicine may be used for other purposes; ask your health care provider or pharmacist if you have questions. COMMON BRAND NAME(S): Valtrex What should I tell my health care provider before I take this medicine? They need to know if you have any of these conditions:  acquired immunodeficiency syndrome (AIDS)  any other condition that may weaken the immune system  bone marrow or kidney transplant  kidney disease  an unusual or allergic reaction to valacyclovir, acyclovir, ganciclovir, valganciclovir, other medicines, foods, dyes, or preservatives  pregnant or trying to get pregnant  breast-feeding How should I use this medicine? Take this medicine by mouth with a glass of water. Follow the directions on the prescription label. You can take this medicine with or without food. Take your doses at regular intervals. Do not take your medicine more often than directed. Finish the full course prescribed by your doctor or health care professional even if you think your condition is better. Do not stop taking except on the advice of your doctor or health care professional. Talk to your pediatrician regarding the use of this medicine in children. While this drug may be prescribed for children as young as 2 years for selected conditions, precautions do apply. Overdosage: If you think you have taken too much of this medicine contact a poison control center or emergency room at once. NOTE: This medicine is only for you. Do not share this medicine with others. What if I miss a dose? If you miss a dose, take it as soon as you can. If it is almost time for your next dose, take only that dose. Do not take double or extra  doses. What may interact with this medicine? Do not take this medicine with any of the following medications:  cidofovir This medicine may also interact with the following medications:  adefovir  amphotericin B  certain antibiotics like amikacin, gentamicin, tobramycin, vancomycin  cimetidine  cisplatin  colistin  cyclosporine  foscarnet  lithium  methotrexate  probenecid  tacrolimus This list may not describe all possible interactions. Give your health care provider a list of all the medicines, herbs, non-prescription drugs, or dietary supplements you use. Also tell them if you smoke, drink alcohol, or use illegal drugs. Some items may interact with your medicine. What should I watch for while using this medicine? Tell your doctor or health care professional if your symptoms do not start to get better after 1 week. This medicine works best when taken early in the course of an infection, within the first 59 hours. Begin treatment as soon as possible after the first signs of infection like tingling, itching, or pain in the affected area. It is possible that genital herpes may still be spread even when you are not having symptoms. Always use safer sex practices like condoms made of latex or polyurethane whenever you have sexual contact. You should stay well hydrated while taking this medicine. Drink plenty of fluids. What side effects may I notice from receiving this medicine? Side effects that you should report to your doctor or health care professional as soon as possible:  allergic reactions like skin rash, itching or hives, swelling of the face, lips, or tongue  aggressive behavior  confusion  hallucinations  problems  with balance, talking, walking  stomach pain  tremor  trouble passing urine or change in the amount of urine Side effects that usually do not require medical attention (report to your doctor or health care professional if they continue or are  bothersome):  dizziness  headache  nausea, vomiting This list may not describe all possible side effects. Call your doctor for medical advice about side effects. You may report side effects to FDA at 1-800-FDA-1088. Where should I keep my medicine? Keep out of the reach of children. Store at room temperature between 15 and 25 degrees C (59 and 77 degrees F). Keep container tightly closed. Throw away any unused medicine after the expiration date. NOTE: This sheet is a summary. It may not cover all possible information. If you have questions about this medicine, talk to your doctor, pharmacist, or health care provider.  2020 Elsevier/Gold Standard (2018-09-22 12:22:33)   Vaginal Delivery  Vaginal delivery means that you give birth by pushing your baby out of your birth canal (vagina). A team of health care providers will help you before, during, and after vaginal delivery. Birth experiences are unique for every woman and every pregnancy, and birth experiences vary depending on where you choose to give birth. What happens when I arrive at the birth center or hospital? Once you are in labor and have been admitted into the hospital or birth center, your health care provider may:  Review your pregnancy history and any concerns that you have.  Insert an IV into one of your veins. This may be used to give you fluids and medicines.  Check your blood pressure, pulse, temperature, and heart rate (vital signs).  Check whether your bag of water (amniotic sac) has broken (ruptured).  Talk with you about your birth plan and discuss pain control options. Monitoring Your health care provider may monitor your contractions (uterine monitoring) and your baby's heart rate (fetal monitoring). You may need to be monitored:  Often, but not continuously (intermittently).  All the time or for long periods at a time (continuously). Continuous monitoring may be needed if: ? You are taking certain medicines,  such as medicine to relieve pain or make your contractions stronger. ? You have pregnancy or labor complications. Monitoring may be done by:  Placing a special stethoscope or a handheld monitoring device on your abdomen to check your baby's heartbeat and to check for contractions.  Placing monitors on your abdomen (external monitors) to record your baby's heartbeat and the frequency and length of contractions.  Placing monitors inside your uterus through your vagina (internal monitors) to record your baby's heartbeat and the frequency, length, and strength of your contractions. Depending on the type of monitor, it may remain in your uterus or on your baby's head until birth.  Telemetry. This is a type of continuous monitoring that can be done with external or internal monitors. Instead of having to stay in bed, you are able to move around during telemetry. Physical exam Your health care provider may perform frequent physical exams. This may include:  Checking how and where your baby is positioned in your uterus.  Checking your cervix to determine: ? Whether it is thinning out (effacing). ? Whether it is opening up (dilating). What happens during labor and delivery?  Normal labor and delivery is divided into the following three stages: Stage 1  This is the longest stage of labor.  This stage can last for hours or days.  Throughout this stage, you will feel contractions.  Contractions generally feel mild, infrequent, and irregular at first. They get stronger, more frequent (about every 2-3 minutes), and more regular as you move through this stage.  This stage ends when your cervix is completely dilated to 4 inches (10 cm) and completely effaced. Stage 2  This stage starts once your cervix is completely effaced and dilated and lasts until the delivery of your baby.  This stage may last from 20 minutes to 2 hours.  This is the stage where you will feel an urge to push your baby out of  your vagina.  You may feel stretching and burning pain, especially when the widest part of your baby's head passes through the vaginal opening (crowning).  Once your baby is delivered, the umbilical cord will be clamped and cut. This usually occurs after waiting a period of 1-2 minutes after delivery.  Your baby will be placed on your bare chest (skin-to-skin contact) in an upright position and covered with a warm blanket. Watch your baby for feeding cues, like rooting or sucking, and help the baby to your breast for his or her first feeding. Stage 3  This stage starts immediately after the birth of your baby and ends after you deliver the placenta.  This stage may take anywhere from 5 to 30 minutes.  After your baby has been delivered, you will feel contractions as your body expels the placenta and your uterus contracts to control bleeding. What can I expect after labor and delivery?  After labor is over, you and your baby will be monitored closely until you are ready to go home to ensure that you are both healthy. Your health care team will teach you how to care for yourself and your baby.  You and your baby will stay in the same room (rooming in) during your hospital stay. This will encourage early bonding and successful breastfeeding.  You may continue to receive fluids and medicines through an IV.  Your uterus will be checked and massaged regularly (fundal massage).  You will have some soreness and pain in your abdomen, vagina, and the area of skin between your vaginal opening and your anus (perineum).  If an incision was made near your vagina (episiotomy) or if you had some vaginal tearing during delivery, cold compresses may be placed on your episiotomy or your tear. This helps to reduce pain and swelling.  You may be given a squirt bottle to use instead of wiping when you go to the bathroom. To use the squirt bottle, follow these steps: ? Before you urinate, fill the squirt  bottle with warm water. Do not use hot water. ? After you urinate, while you are sitting on the toilet, use the squirt bottle to rinse the area around your urethra and vaginal opening. This rinses away any urine and blood. ? Fill the squirt bottle with clean water every time you use the bathroom.  It is normal to have vaginal bleeding after delivery. Wear a sanitary pad for vaginal bleeding and discharge. Summary  Vaginal delivery means that you will give birth by pushing your baby out of your birth canal (vagina).  Your health care provider may monitor your contractions (uterine monitoring) and your baby's heart rate (fetal monitoring).  Your health care provider may perform a physical exam.  Normal labor and delivery is divided into three stages.  After labor is over, you and your baby will be monitored closely until you are ready to go home. This information is not intended to  replace advice given to you by your health care provider. Make sure you discuss any questions you have with your health care provider. Document Revised: 09/30/2017 Document Reviewed: 09/30/2017 Elsevier Patient Education  2020 [employer].

## 2019-12-30 NOTE — Progress Notes (Signed)
COVID-19 Vaccine Information can be found at: [email or web address] For questions related to vaccine distribution or appointments, please email [email or web address] or call [phone].   3

## 2019-12-31 LAB — CBC
Hematocrit: 28.5 % — ABNORMAL LOW (ref 34.0–46.6)
Hemoglobin: 8.7 g/dL — ABNORMAL LOW (ref 11.1–15.9)
MCH: 22 pg — ABNORMAL LOW (ref 26.6–33.0)
MCHC: 30.5 g/dL — ABNORMAL LOW (ref 31.5–35.7)
MCV: 72 fL — ABNORMAL LOW (ref 79–97)
Platelets: 154 10*3/uL (ref 150–450)
RBC: 3.95 x10E6/uL (ref 3.77–5.28)
RDW: 16 % — ABNORMAL HIGH (ref 11.7–15.4)
WBC: 6.4 10*3/uL (ref 3.4–10.8)

## 2019-12-31 NOTE — Progress Notes (Addendum)
ROB-Doing well except for low back pain. Discussed home treatment measures. Pre-labor check list and herbal prep guide provided. 36 week cultures collected, see orders. Rx Valtrex, see orders. Repeat CBC today due to history anemia. Anticipatory guidance regarding course of prenatal care. Reviewed red flag symptoms and when to call. RTC x 1 week for ROB or sooner if needed.   Glorious Peach, [facility], [employer]  Gunnar Bulla, CNM, [facility], [place]

## 2020-01-01 ENCOUNTER — Encounter: Payer: Self-pay | Admitting: Certified Nurse Midwife

## 2020-01-01 DIAGNOSIS — B951 Streptococcus, group B, as the cause of diseases classified elsewhere: Secondary | ICD-10-CM | POA: Insufficient documentation

## 2020-01-01 LAB — GC/CHLAMYDIA PROBE AMP
Chlamydia trachomatis, NAA: NEGATIVE
Neisseria Gonorrhoeae by PCR: NEGATIVE

## 2020-01-01 LAB — STREP GP B NAA: Strep Gp B NAA: POSITIVE — AB

## 2020-01-01 NOTE — Progress Notes (Signed)
Please contact patient. Anemia worse, will give samples of iron at next visit. GBS swab positive, will need antibiotics in labor. Encourage to activate MyChart. Thanks, JML

## 2020-01-03 ENCOUNTER — Telehealth: Payer: Self-pay | Admitting: Certified Nurse Midwife

## 2020-01-03 ENCOUNTER — Telehealth: Payer: Self-pay

## 2020-01-03 NOTE — Telephone Encounter (Signed)
Patient returned you call advised pt you left vm patient indicated she would like call back. Thanks.

## 2020-01-03 NOTE — Telephone Encounter (Signed)
Voicemail message left for patient giving her Claretta Fraise CNM instructions.

## 2020-01-03 NOTE — Telephone Encounter (Signed)
Spoke with patient- all questions about GBS and Valtrex prescription answered.

## 2020-01-05 ENCOUNTER — Other Ambulatory Visit: Payer: Self-pay

## 2020-01-05 ENCOUNTER — Ambulatory Visit (INDEPENDENT_AMBULATORY_CARE_PROVIDER_SITE_OTHER): Payer: Medicaid Other | Admitting: Certified Nurse Midwife

## 2020-01-05 VITALS — BP 121/74 | HR 73 | Wt 196.1 lb

## 2020-01-05 DIAGNOSIS — Z3A36 36 weeks gestation of pregnancy: Secondary | ICD-10-CM

## 2020-01-05 MED ORDER — VALACYCLOVIR HCL 1 G PO TABS: 1000.0000 mg | ORAL_TABLET | Freq: Every day | ORAL | 1 refills | Status: AC

## 2020-01-05 NOTE — Patient Instructions (Signed)
Group B Streptococcus Infection During Pregnancy °Group B Streptococcus (GBS) is a type of bacteria that is often found in healthy people. It is commonly found in the rectum, vagina, and intestines. In people who are healthy and not pregnant, the bacteria rarely cause serious illness or complications. However, women who test positive for GBS during pregnancy can pass the bacteria to the baby during childbirth. This can cause serious infection in the baby after birth. °Women with GBS may also have infections during their pregnancy or soon after childbirth. The infections include urinary tract infections (UTIs) or infections of the uterus. GBS also increases a woman's risk of complications during pregnancy, such as early labor or delivery, miscarriage, or stillbirth. Routine testing for GBS is recommended for all pregnant women. °What are the causes? °This condition is caused by bacteria called Streptococcus agalactiae. °What increases the risk? °You may have a higher risk for GBS infection during pregnancy if you had one during a past pregnancy. °What are the signs or symptoms? °In most cases, GBS infection does not cause symptoms in pregnant women. If symptoms exist, they may include: °· Labor that starts before the 37th week of pregnancy. °· A UTI or bladder infection. This may cause a fever, frequent urination, or pain and burning during urination. °· Fever during labor. There can also be a rapid heartbeat in the mother or baby. °Rare but serious symptoms of a GBS infection in women include: °· Blood infection (septicemia). This may cause fever, chills, or confusion. °· Lung infection (pneumonia). This may cause fever, chills, cough, rapid breathing, chest pain, or difficulty breathing. °· Bone, joint, skin, or soft tissue infection. °How is this diagnosed? °You may be screened for GBS between week 35 and week 37 of pregnancy. If you have symptoms of preterm labor, you may be screened earlier. This condition is  diagnosed based on lab test results from: °· A swab of fluid from the vagina and rectum. °· A urine sample. °How is this treated? °This condition is treated with antibiotic medicine. Antibiotic medicine may be given: °· To you when you go into labor, or as soon as your water breaks. The medicines will continue until after you give birth. If you are having a cesarean delivery, you do not need antibiotics unless your water has broken. °· To your baby, if he or she requires treatment. Your health care provider will check your baby to decide if he or she needs antibiotics to prevent a serious infection. °Follow these instructions at home: °· Take over-the-counter and prescription medicines only as told by your health care provider. °· Take your antibiotic medicine as told by your health care provider. Do not stop taking the antibiotic even if you start to feel better. °· Keep all pre-birth (prenatal) visits and follow-up visits as told by your health care provider. This is important. °Contact a health care provider if: °· You have pain or burning when you urinate. °· You have to urinate more often than usual. °· You have a fever or chills. °· You develop a bad-smelling vaginal discharge. °Get help right away if: °· Your water breaks. °· You go into labor. °· You have severe pain in your abdomen. °· You have difficulty breathing. °· You have chest pain. °These symptoms may represent a serious problem that is an emergency. Do not wait to see if the symptoms will go away. Get medical help right away. Call your local emergency services (911 in the [place] Do not drive yourself to   the hospital. °Summary °· GBS is a type of bacteria that is common in healthy people. °· During pregnancy, colonization with GBS can cause serious complications for you or your baby. °· Your health care provider will screen you between 35 and 37 weeks of pregnancy to determine if you are colonized with GBS. °· If you are colonized with GBS during  pregnancy, your health care provider will recommend antibiotics through an IV during labor. °· After delivery, your baby will be evaluated for complications related to potential GBS infection and may require antibiotics to prevent a serious infection. °This information is not intended to replace advice given to you by your health care provider. Make sure you discuss any questions you have with your health care provider. °Document Revised: 03/22/2019 Document Reviewed: 03/22/2019 °Elsevier Patient Education © 2020 [employer] Inc. ° °

## 2020-01-05 NOTE — Progress Notes (Signed)
ROB doing well. Feels good movement. GBS results reviwed with pt. She verbalizes and agrees to plan. Labor precautions discussed. Self help measures reviewed for musculoskeletal discomforts in pregnancy. Follow up 1 wk with [name].   Doreene Burke , CNM

## 2020-01-14 ENCOUNTER — Other Ambulatory Visit: Payer: Self-pay

## 2020-01-14 ENCOUNTER — Ambulatory Visit (INDEPENDENT_AMBULATORY_CARE_PROVIDER_SITE_OTHER): Payer: Medicaid Other | Admitting: Certified Nurse Midwife

## 2020-01-14 VITALS — BP 119/77 | HR 87 | Wt 200.3 lb

## 2020-01-14 DIAGNOSIS — Z3A38 38 weeks gestation of pregnancy: Secondary | ICD-10-CM

## 2020-01-14 DIAGNOSIS — B951 Streptococcus, group B, as the cause of diseases classified elsewhere: Secondary | ICD-10-CM

## 2020-01-14 DIAGNOSIS — Z3493 Encounter for supervision of normal pregnancy, unspecified, third trimester: Secondary | ICD-10-CM

## 2020-01-14 DIAGNOSIS — O0933 Supervision of pregnancy with insufficient antenatal care, third trimester: Secondary | ICD-10-CM

## 2020-01-14 LAB — POCT URINALYSIS DIPSTICK OB
Bilirubin, UA: NEGATIVE
Blood, UA: NEGATIVE
Glucose, UA: NEGATIVE
Ketones, UA: NEGATIVE
Leukocytes, UA: NEGATIVE
Nitrite, UA: NEGATIVE
POC,PROTEIN,UA: NEGATIVE
Spec Grav, UA: 1.02 (ref 1.010–1.025)
Urobilinogen, UA: 0.2 E.U./dL
pH, UA: 5 (ref 5.0–8.0)

## 2020-01-14 NOTE — Patient Instructions (Signed)
Fetal Movement Counts Patient Name: ________________________________________________ Patient Due Date: ____________________ What is a fetal movement count?  A fetal movement count is the number of times that you feel your baby move during a certain amount of time. This may also be called a fetal kick count. A fetal movement count is recommended for every pregnant woman. You may be asked to start counting fetal movements as early as week 28 of your pregnancy. Pay attention to when your baby is most active. You may notice your baby's sleep and wake cycles. You may also notice things that make your baby move more. You should do a fetal movement count:  When your baby is normally most active.  At the same time each day. A good time to count movements is while you are resting, after having something to eat and drink. How do I count fetal movements? 1. Find a quiet, comfortable area. Sit, or lie down on your side. 2. Write down the date, the start time and stop time, and the number of movements that you felt between those two times. Take this information with you to your health care visits. 3. Write down your start time when you feel the first movement. 4. Count kicks, flutters, swishes, rolls, and jabs. You should feel at least 10 movements. 5. You may stop counting after you have felt 10 movements, or if you have been counting for 2 hours. Write down the stop time. 6. If you do not feel 10 movements in 2 hours, contact your health care provider for further instructions. Your health care provider may want to do additional tests to assess your baby's well-being. Contact a health care provider if:  You feel fewer than 10 movements in 2 hours.  Your baby is not moving like he or she usually does. Date: ____________ Start time: ____________ Stop time: ____________ Movements: ____________ Date: ____________ Start time: ____________ Stop time: ____________ Movements: ____________ Date: ____________  Start time: ____________ Stop time: ____________ Movements: ____________ Date: ____________ Start time: ____________ Stop time: ____________ Movements: ____________ Date: ____________ Start time: ____________ Stop time: ____________ Movements: ____________ Date: ____________ Start time: ____________ Stop time: ____________ Movements: ____________ Date: ____________ Start time: ____________ Stop time: ____________ Movements: ____________ Date: ____________ Start time: ____________ Stop time: ____________ Movements: ____________ Date: ____________ Start time: ____________ Stop time: ____________ Movements: ____________ This information is not intended to replace advice given to you by your health care provider. Make sure you discuss any questions you have with your health care provider. Document Revised: 04/15/2019 Document Reviewed: 04/15/2019 Elsevier Patient Education  2020 Elsevier Inc.   Group B Streptococcus Infection During Pregnancy Group B Streptococcus (GBS) is a type of bacteria that is often found in healthy people. It is commonly found in the rectum, vagina, and intestines. In people who are healthy and not pregnant, the bacteria rarely cause serious illness or complications. However, women who test positive for GBS during pregnancy can pass the bacteria to the baby during childbirth. This can cause serious infection in the baby after birth. Women with GBS may also have infections during their pregnancy or soon after childbirth. The infections include urinary tract infections (UTIs) or infections of the uterus. GBS also increases a woman's risk of complications during pregnancy, such as early labor or delivery, miscarriage, or stillbirth. Routine testing for GBS is recommended for all pregnant women. What are the causes? This condition is caused by bacteria called Streptococcus agalactiae. What increases the risk? You may have a higher risk for GBS  infection during pregnancy if you had  one during a past pregnancy. What are the signs or symptoms? In most cases, GBS infection does not cause symptoms in pregnant women. If symptoms exist, they may include:  Labor that starts before the 37th week of pregnancy.  A UTI or bladder infection. This may cause a fever, frequent urination, or pain and burning during urination.  Fever during labor. There can also be a rapid heartbeat in the mother or baby. Rare but serious symptoms of a GBS infection in women include:  Blood infection (septicemia). This may cause fever, chills, or confusion.  Lung infection (pneumonia). This may cause fever, chills, cough, rapid breathing, chest pain, or difficulty breathing.  Bone, joint, skin, or soft tissue infection. How is this diagnosed? You may be screened for GBS between week 35 and week 37 of pregnancy. If you have symptoms of preterm labor, you may be screened earlier. This condition is diagnosed based on lab test results from:  A swab of fluid from the vagina and rectum.  A urine sample. How is this treated? This condition is treated with antibiotic medicine. Antibiotic medicine may be given:  To you when you go into labor, or as soon as your water breaks. The medicines will continue until after you give birth. If you are having a cesarean delivery, you do not need antibiotics unless your water has broken.  To your baby, if he or she requires treatment. Your health care provider will check your baby to decide if he or she needs antibiotics to prevent a serious infection. Follow these instructions at home:  Take over-the-counter and prescription medicines only as told by your health care provider.  Take your antibiotic medicine as told by your health care provider. Do not stop taking the antibiotic even if you start to feel better.  Keep all pre-birth (prenatal) visits and follow-up visits as told by your health care provider. This is important. Contact a health care provider if:   You have pain or burning when you urinate.  You have to urinate more often than usual.  You have a fever or chills.  You develop a bad-smelling vaginal discharge. Get help right away if:  Your water breaks.  You go into labor.  You have severe pain in your abdomen.  You have difficulty breathing.  You have chest pain. These symptoms may represent a serious problem that is an emergency. Do not wait to see if the symptoms will go away. Get medical help right away. Call your local emergency services (911 in the [place] Do not drive yourself to the hospital. Summary  GBS is a type of bacteria that is common in healthy people.  During pregnancy, colonization with GBS can cause serious complications for you or your baby.  Your health care provider will screen you between 35 and 37 weeks of pregnancy to determine if you are colonized with GBS.  If you are colonized with GBS during pregnancy, your health care provider will recommend antibiotics through an IV during labor.  After delivery, your baby will be evaluated for complications related to potential GBS infection and may require antibiotics to prevent a serious infection. This information is not intended to replace advice given to you by your health care provider. Make sure you discuss any questions you have with your health care provider. Document Revised: 03/22/2019 Document Reviewed: 03/22/2019 Elsevier Patient Education  [facility] [facility].

## 2020-01-14 NOTE — Progress Notes (Signed)
ROB- Doing well. Forgot to bring birth plan, will bring next visit. Would like IUD postpartum. Handouts given. Plans to breastfeed. Cervical exam and membrane sweep preformed by CNM. Anticipatory guidance given and reviewed red flags and when to call the office.  RTC x 1 week for ROB or sooner if needed.  Glorious Peach RN [facility] [employer] 01/14/20 5:28 PM

## 2020-01-14 NOTE — Progress Notes (Signed)
I have seen, interviewed, and examined the patient in conjunction with the [employer] Nurse Practitioner student and affirm the diagnosis and management plan.   Gunnar Bulla, CNM [facility], [facility] 01/14/20 5:36 PM

## 2020-01-23 ENCOUNTER — Inpatient Hospital Stay: Payer: Medicaid Other | Admitting: Anesthesiology

## 2020-01-23 ENCOUNTER — Encounter: Payer: Self-pay | Admitting: Obstetrics and Gynecology

## 2020-01-23 ENCOUNTER — Inpatient Hospital Stay
Admission: EM | Admit: 2020-01-23 | Discharge: 2020-01-25 | DRG: 806 | Disposition: A | Discharge status: Home / Self Care | Payer: Medicaid Other | Attending: Certified Nurse Midwife | Admitting: Certified Nurse Midwife

## 2020-01-23 ENCOUNTER — Other Ambulatory Visit: Payer: Self-pay

## 2020-01-23 DIAGNOSIS — Z3A39 39 weeks gestation of pregnancy: Secondary | ICD-10-CM | POA: Diagnosis not present

## 2020-01-23 DIAGNOSIS — O9852 Other viral diseases complicating childbirth: Secondary | ICD-10-CM | POA: Diagnosis present

## 2020-01-23 DIAGNOSIS — Z20822 Contact with and (suspected) exposure to covid-19: Secondary | ICD-10-CM | POA: Diagnosis present

## 2020-01-23 DIAGNOSIS — B009 Herpesviral infection, unspecified: Secondary | ICD-10-CM | POA: Diagnosis present

## 2020-01-23 DIAGNOSIS — B951 Streptococcus, group B, as the cause of diseases classified elsewhere: Secondary | ICD-10-CM | POA: Diagnosis not present

## 2020-01-23 DIAGNOSIS — O99824 Streptococcus B carrier state complicating childbirth: Secondary | ICD-10-CM | POA: Diagnosis present

## 2020-01-23 LAB — CBC
HCT: 34.4 % — ABNORMAL LOW (ref 36.0–46.0)
Hemoglobin: 10.3 g/dL — ABNORMAL LOW (ref 12.0–15.0)
MCH: 21.6 pg — ABNORMAL LOW (ref 26.0–34.0)
MCHC: 29.9 g/dL — ABNORMAL LOW (ref 30.0–36.0)
MCV: 72.1 fL — ABNORMAL LOW (ref 80.0–100.0)
Platelets: 195 10*3/uL (ref 150–400)
RBC: 4.77 MIL/uL (ref 3.87–5.11)
RDW: 19.1 % — ABNORMAL HIGH (ref 11.5–15.5)
WBC: 6.8 10*3/uL (ref 4.0–10.5)
nRBC: 0 % (ref 0.0–0.2)

## 2020-01-23 LAB — ABO/RH: ABO/RH(D): A POS

## 2020-01-23 LAB — SARS CORONAVIRUS 2 BY RT PCR (HOSPITAL ORDER, PERFORMED IN ~~LOC~~ HOSPITAL LAB): SARS Coronavirus 2: NEGATIVE

## 2020-01-23 LAB — TYPE AND SCREEN
ABO/RH(D): A POS
Antibody Screen: NEGATIVE

## 2020-01-23 LAB — RUPTURE OF MEMBRANE (ROM)PLUS: Rom Plus: POSITIVE

## 2020-01-23 MED ORDER — LIDOCAINE HCL (PF) 1 % IJ SOLN
INTRAMUSCULAR | Status: AC
  Filled 2020-01-23: qty 30

## 2020-01-23 MED ORDER — METHYLERGONOVINE MALEATE 0.2 MG PO TABS
0.2000 mg | ORAL_TABLET | ORAL | Status: DC | PRN
  Filled 2020-01-23: qty 1

## 2020-01-23 MED ORDER — LACTATED RINGERS IV SOLN
500.0000 mL | INTRAVENOUS | Status: DC | PRN
  Administered 2020-01-23: 500 mL via INTRAVENOUS

## 2020-01-23 MED ORDER — FENTANYL 2.5 MCG/ML W/ROPIVACAINE 0.15% IN NS 100 ML EPIDURAL (ARMC): 12.0000 mL/h | EPIDURAL | Status: DC

## 2020-01-23 MED ORDER — LACTATED RINGERS IV SOLN: 500.0000 mL | Freq: Once | INTRAVENOUS | Status: DC

## 2020-01-23 MED ORDER — OXYCODONE HCL 5 MG PO TABS
5.0000 mg | ORAL_TABLET | ORAL | Status: DC | PRN
  Administered 2020-01-23: 5 mg via ORAL
  Filled 2020-01-23: qty 1

## 2020-01-23 MED ORDER — MISOPROSTOL 200 MCG PO TABS
ORAL_TABLET | ORAL | Status: AC
  Filled 2020-01-23: qty 4

## 2020-01-23 MED ORDER — LIDOCAINE HCL (PF) 1 % IJ SOLN
30.0000 mL | INTRAMUSCULAR | Status: AC | PRN
  Administered 2020-01-23: 1.2 mL via SUBCUTANEOUS

## 2020-01-23 MED ORDER — OXYTOCIN BOLUS FROM INFUSION
500.0000 mL | Freq: Once | INTRAVENOUS | Status: AC
  Administered 2020-01-23: 500 mL via INTRAVENOUS

## 2020-01-23 MED ORDER — PRENATAL MULTIVITAMIN CH
1.0000 | ORAL_TABLET | Freq: Every day | ORAL | Status: DC
  Administered 2020-01-23 – 2020-01-24 (×2): 1 via ORAL
  Filled 2020-01-23 (×2): qty 1

## 2020-01-23 MED ORDER — DIBUCAINE (PERIANAL) 1 % EX OINT: 1.0000 "application " | TOPICAL_OINTMENT | CUTANEOUS | Status: DC | PRN

## 2020-01-23 MED ORDER — IBUPROFEN 600 MG PO TABS
600.0000 mg | ORAL_TABLET | Freq: Four times a day (QID) | ORAL | Status: DC
  Administered 2020-01-23 – 2020-01-25 (×8): 600 mg via ORAL
  Filled 2020-01-23 (×8): qty 1

## 2020-01-23 MED ORDER — COCONUT OIL OIL
1.0000 "application " | TOPICAL_OIL | Status: DC | PRN
  Administered 2020-01-23: 1 via TOPICAL
  Filled 2020-01-23: qty 120

## 2020-01-23 MED ORDER — DOCUSATE SODIUM 100 MG PO CAPS
100.0000 mg | ORAL_CAPSULE | Freq: Two times a day (BID) | ORAL | Status: DC
  Administered 2020-01-23 – 2020-01-25 (×4): 100 mg via ORAL
  Filled 2020-01-23 (×4): qty 1

## 2020-01-23 MED ORDER — FERROUS SULFATE 325 (65 FE) MG PO TABS
325.0000 mg | ORAL_TABLET | Freq: Every day | ORAL | Status: DC
  Administered 2020-01-24 – 2020-01-25 (×2): 325 mg via ORAL
  Filled 2020-01-23 (×2): qty 1

## 2020-01-23 MED ORDER — METHYLERGONOVINE MALEATE 0.2 MG/ML IJ SOLN: 0.2000 mg | INTRAMUSCULAR | Status: DC | PRN

## 2020-01-23 MED ORDER — ONDANSETRON HCL 4 MG/2ML IJ SOLN: 4.0000 mg | Freq: Four times a day (QID) | INTRAMUSCULAR | Status: DC | PRN

## 2020-01-23 MED ORDER — AMMONIA AROMATIC IN INHA
RESPIRATORY_TRACT | Status: AC
  Filled 2020-01-23: qty 10

## 2020-01-23 MED ORDER — LIDOCAINE-EPINEPHRINE (PF) 1.5 %-1:200000 IJ SOLN
INTRAMUSCULAR | Status: DC | PRN
  Administered 2020-01-23: 3 mL via EPIDURAL

## 2020-01-23 MED ORDER — OXYTOCIN 10 UNIT/ML IJ SOLN
INTRAMUSCULAR | Status: AC
  Filled 2020-01-23: qty 2

## 2020-01-23 MED ORDER — OXYTOCIN 40 UNITS IN NORMAL SALINE INFUSION - SIMPLE MED: 2.5000 [IU]/h | INTRAVENOUS | Status: DC

## 2020-01-23 MED ORDER — OXYTOCIN 40 UNITS IN NORMAL SALINE INFUSION - SIMPLE MED
INTRAVENOUS | Status: AC
  Filled 2020-01-23: qty 1000

## 2020-01-23 MED ORDER — FENTANYL 2.5 MCG/ML W/ROPIVACAINE 0.15% IN NS 100 ML EPIDURAL (ARMC)
EPIDURAL | Status: AC
  Filled 2020-01-23: qty 100

## 2020-01-23 MED ORDER — SODIUM CHLORIDE 0.9 % IV SOLN
2.0000 g | Freq: Once | INTRAVENOUS | Status: AC
  Administered 2020-01-23: 2 g via INTRAVENOUS
  Filled 2020-01-23: qty 2000

## 2020-01-23 MED ORDER — SIMETHICONE 80 MG PO CHEW: 80.0000 mg | CHEWABLE_TABLET | ORAL | Status: DC | PRN

## 2020-01-23 MED ORDER — BUTORPHANOL TARTRATE 1 MG/ML IJ SOLN: 1.0000 mg | INTRAMUSCULAR | Status: DC | PRN

## 2020-01-23 MED ORDER — LACTATED RINGERS IV SOLN: INTRAVENOUS | Status: DC

## 2020-01-23 MED ORDER — BENZOCAINE-MENTHOL 20-0.5 % EX AERO
1.0000 "application " | INHALATION_SPRAY | CUTANEOUS | Status: DC | PRN
  Administered 2020-01-23: 1 via TOPICAL
  Filled 2020-01-23 (×2): qty 56

## 2020-01-23 MED ORDER — SENNOSIDES-DOCUSATE SODIUM 8.6-50 MG PO TABS
2.0000 | ORAL_TABLET | ORAL | Status: DC
  Administered 2020-01-23: 2 via ORAL
  Filled 2020-01-23 (×2): qty 2

## 2020-01-23 MED ORDER — SOD CITRATE-CITRIC ACID 500-334 MG/5ML PO SOLN: 30.0000 mL | ORAL | Status: DC | PRN

## 2020-01-23 MED ORDER — ONDANSETRON HCL 4 MG/2ML IJ SOLN: 4.0000 mg | INTRAMUSCULAR | Status: DC | PRN

## 2020-01-23 MED ORDER — DIPHENHYDRAMINE HCL 50 MG/ML IJ SOLN: 12.5000 mg | INTRAMUSCULAR | Status: DC | PRN

## 2020-01-23 MED ORDER — OXYCODONE HCL 5 MG PO TABS: 10.0000 mg | ORAL_TABLET | ORAL | Status: DC | PRN

## 2020-01-23 MED ORDER — WITCH HAZEL-GLYCERIN EX PADS: 1.0000 "application " | MEDICATED_PAD | CUTANEOUS | Status: DC | PRN

## 2020-01-23 MED ORDER — FENTANYL 2.5 MCG/ML W/ROPIVACAINE 0.15% IN NS 100 ML EPIDURAL (ARMC)
EPIDURAL | Status: DC | PRN
  Administered 2020-01-23: 12 mL/h via EPIDURAL

## 2020-01-23 MED ORDER — ONDANSETRON HCL 4 MG PO TABS: 4.0000 mg | ORAL_TABLET | ORAL | Status: DC | PRN

## 2020-01-23 NOTE — OB Triage Note (Signed)
Pt is a G4P2 at [redacted]w[redacted]d that presents from the ED with c/o ctx that started around 2am. Pt denies VB, LOF and states positive FM. EFM applied and initial FHT 130.

## 2020-01-23 NOTE — Anesthesia Procedure Notes (Signed)
Epidural Patient location during procedure: OB Start time: 01/23/2020 7:32 AM End time: 01/23/2020 7:47 AM  Staffing Performed: anesthesiologist   Preanesthetic Checklist Completed: patient identified, IV checked, site marked, risks and benefits discussed, surgical consent, monitors and equipment checked, pre-op evaluation and timeout performed  Epidural Patient position: sitting Prep: Betadine Patient monitoring: heart rate, continuous pulse ox and blood pressure Approach: midline Location: L4-L5 Injection technique: LOR saline  Needle:  Needle type: Tuohy  Needle gauge: 17 G Needle length: 9 cm and 9 Needle insertion depth: 8 cm Catheter type: closed end flexible Catheter size: 19 Gauge Catheter at skin depth: 13 cm Test dose: negative and 1.5% lidocaine with Epi 1:200 K  Assessment Events: blood not aspirated, injection not painful, no injection resistance, no paresthesia and negative IV test  Additional Notes   Patient tolerated the insertion well without complications.Reason for block:procedure for pain

## 2020-01-23 NOTE — H&P (Addendum)
History and Physical   HPI  Jodi Rhodes is a 29 y.o. [id number] at [redacted]w[redacted]d Estimated Date of Delivery: 01/28/20 who is being admitted for labor management and rupture of membranes.    OB History  OB History  Gravida Para Term Preterm AB Living  4 3 3  0 0 2  SAB TAB Ectopic Multiple Live Births  0 0 0 0 2    # Outcome Date GA Lbr Len/2nd Weight Sex Delivery Anes PTL Lv  4 Current           3 Term 07/17/18   3345 g F Vag-Spont  N   2 Term 06/12/16   3345 g F Vag-Spont  N LIV  1 Term 07/06/12   2835 g F Vag-Spont  N LIV    PROBLEM LIST  Pregnancy complications or risks: Patient Active Problem List   Diagnosis Date Noted  . Labor and delivery, indication for care 01/23/2020  . Group beta Strep positive 01/01/2020  . Limited prenatal care in third trimester 12/03/2019  . Fall against object 09/16/2019    Prenatal labs and studies: ABO, Rh: --/--/A POS (05/16 04-26-2005) Antibody: NEG (05/16 04-26-2005) Rubella: Immune (11/02 0000) RPR: Non Reactive (04/07 1407)  HBsAg: Negative (11/02 0000)  HIV: Non-reactive (11/02 0000)  [birth year]1973-- (04/22 1642)   Past Medical History:  Diagnosis Date  . Anemia   . Herpes 2018  . Spinal headache 2019   nerve damage from epidural     Past Surgical History:  Procedure Laterality Date  . NO PAST SURGERIES       Medications    Current Discharge Medication List    CONTINUE these medications which have NOT CHANGED   Details  Iron-FA-B Cmp-C-Biot-Probiotic (FUSION PLUS PO) Take by mouth.    Prenatal Vit-Fe Fumarate-FA (PRENATAL MULTIVITAMIN) TABS tablet Take 1 tablet by mouth daily at 12 noon.    !! valACYclovir (VALTREX) 1000 MG tablet Take 1 tablet (1,000 mg total) by mouth daily. Qty: 30 tablet, Refills: 1    acetaminophen-codeine (TYLENOL #3) 300-30 MG tablet Take 1 tablet by mouth every 4 (four) hours as needed for moderate pain. Qty: 5 tablet, Refills: 0    loratadine (CLARITIN) 10 MG tablet Take 1 tablet (10 mg  total) by mouth daily as needed for allergies. Qty: 30 tablet, Refills: 0    !! valACYclovir (VALTREX) 500 MG tablet Take 1 tablet (500 mg total) by mouth 2 (two) times daily. Qty: 30 tablet, Refills: 6     !! - Potential duplicate medications found. Please discuss with provider.       Allergies  Dimetapp multisymptom cold-flu [phenyleph-cpm-dm-apap]  Review of Systems  Constitutional: negative Eyes: negative Ears, nose, mouth, throat, and face: negative Respiratory: negative Cardiovascular: negative Gastrointestinal: negative Genitourinary:negative Integument/breast: negative Hematologic/lymphatic: negative Musculoskeletal:negative Neurological: negative Behavioral/Psych: negative Endocrine: negative Allergic/Immunologic: negative  Physical Exam  BP (!) 157/98   Pulse 81   Temp 97.7 F (36.5 C) (Oral)   Resp 15   Ht 5\' 4"  (1.626 m)   Wt 90.7 kg   LMP 04/23/2019   SpO2 99%   BMI 34.33 kg/m   Lungs:  CTA B Cardio: RRR without M/R/G Abd: Soft, gravid, NT Presentation: cephalic EXT: No C/C/ 1+ Edema DTRs: 2+ B CERVIX: Dilation: 8 Effacement (%): 90 Station: -1 Presentation: Vertex Exam by:: A Thompson CNM No signs of HSV lesions.  See Prenatal records for more detailed PE.     FHR:  Baseline: 130 bpm, Variability:  Good {> 6 bpm), Accelerations: Reactive and Decelerations: Absent  Toco: Uterine Contractions: Frequency: Every 2-3 minutes and Intensity: moderate  Test Results  Results for orders placed or performed during the hospital encounter of 01/23/20 (from the past 24 hour(s))  ROM Plus ([facility] only)     Status: None   Collection Time: 01/23/20  6:22 AM  Result Value Ref Range   Rom Plus POSITIVE   SARS Coronavirus 2 by RT PCR (hospital order, performed in [place] hospital lab) Nasopharyngeal Nasopharyngeal Swab     Status: None   Collection Time: 01/23/20  6:35 AM   Specimen: Nasopharyngeal Swab  Result Value Ref Range   SARS  Coronavirus 2 NEGATIVE NEGATIVE  Type and screen [place]     Status: None   Collection Time: 01/23/20  6:35 AM  Result Value Ref Range   ABO/RH(D) A POS    Antibody Screen NEG    Sample Expiration      01/26/2020,2359 Performed at [facility], [address] [place], [place] [address]   CBC     Status: Abnormal   Collection Time: 01/23/20  6:35 AM  Result Value Ref Range   WBC 6.8 4.0 - 10.5 K/uL   RBC 4.77 3.87 - 5.11 MIL/uL   Hemoglobin 10.3 (L) 12.0 - 15.0 g/dL   HCT 34.4 (L) 36.0 - 46.0 %   MCV 72.1 (L) 80.0 - 100.0 fL   MCH 21.6 (L) 26.0 - 34.0 pg   MCHC 29.9 (L) 30.0 - 36.0 g/dL   RDW 19.1 (H) 11.5 - 15.5 %   Platelets 195 150 - 400 K/uL   nRBC 0.0 0.0 - 0.2 %   Group B Strep positive  Assessment   [id number] at [redacted]w[redacted]d Estimated Date of Delivery: 01/28/20  The fetus is reassuring.   Patient Active Problem List   Diagnosis Date Noted  . Labor and delivery, indication for care 01/23/2020  . Group beta Strep positive 01/01/2020  . Limited prenatal care in third trimester 12/03/2019  . Fall against object 09/16/2019  HSV, on valtrex daily  Plan  1. Admitted to L&D :   2. EFM:-- Category 1 3. Stadol or Epidural if desired.   4. Admission labs completed 5. Anticipate NSVD  Philip Aspen, CNM  01/23/2020 8:30 AM

## 2020-01-23 NOTE — Anesthesia Preprocedure Evaluation (Signed)
Anesthesia Evaluation  Patient identified by MRN, date of birth, ID band Patient awake    Reviewed: Allergy & Precautions, NPO status , Patient's Chart, lab work & pertinent test results  History of Anesthesia Complications (+) POST - OP SPINAL HEADACHE and history of anesthetic complications  Airway Mallampati: II       Dental   Pulmonary neg sleep apnea, neg COPD, Not current smoker, former smoker,           Cardiovascular (-) hypertension(-) Past MI and (-) CHF (-) dysrhythmias (-) Valvular Problems/Murmurs     Neuro/Psych neg Seizures    GI/Hepatic Neg liver ROS, neg GERD  ,  Endo/Other  neg diabetes  Renal/GU negative Renal ROS     Musculoskeletal   Abdominal   Peds  Hematology   Anesthesia Other Findings   Reproductive/Obstetrics                             Anesthesia Physical Anesthesia Plan  ASA: II  Anesthesia Plan: Epidural   Post-op Pain Management:    Induction:   PONV Risk Score and Plan:   Airway Management Planned:   Additional Equipment:   Intra-op Plan:   Post-operative Plan:   Informed Consent: I have reviewed the patients History and Physical, chart, labs and discussed the procedure including the risks, benefits and alternatives for the proposed anesthesia with the patient or authorized representative who has indicated his/her understanding and acceptance.       Plan Discussed with:   Anesthesia Plan Comments:         Anesthesia Quick Evaluation

## 2020-01-24 ENCOUNTER — Encounter: Payer: Medicaid Other | Admitting: Certified Nurse Midwife

## 2020-01-24 LAB — CBC
HCT: 28.5 % — ABNORMAL LOW (ref 36.0–46.0)
Hemoglobin: 8.9 g/dL — ABNORMAL LOW (ref 12.0–15.0)
MCH: 22 pg — ABNORMAL LOW (ref 26.0–34.0)
MCHC: 31.2 g/dL (ref 30.0–36.0)
MCV: 70.5 fL — ABNORMAL LOW (ref 80.0–100.0)
Platelets: 142 10*3/uL — ABNORMAL LOW (ref 150–400)
RBC: 4.04 MIL/uL (ref 3.87–5.11)
RDW: 19 % — ABNORMAL HIGH (ref 11.5–15.5)
WBC: 7.8 10*3/uL (ref 4.0–10.5)
nRBC: 0 % (ref 0.0–0.2)

## 2020-01-24 LAB — RPR: RPR Ser Ql: NONREACTIVE

## 2020-01-24 MED ORDER — LORATADINE 10 MG PO TABS
10.0000 mg | ORAL_TABLET | Freq: Every day | ORAL | Status: DC
  Administered 2020-01-24: 10 mg via ORAL
  Filled 2020-01-24 (×2): qty 1

## 2020-01-24 MED ORDER — VALACYCLOVIR HCL 500 MG PO TABS
1000.0000 mg | ORAL_TABLET | Freq: Every day | ORAL | Status: DC
  Administered 2020-01-24: 1000 mg via ORAL
  Filled 2020-01-24 (×2): qty 2

## 2020-01-24 NOTE — Lactation Note (Signed)
This note was copied from a baby's chart. Lactation Consultation Note  Patient Name: Jodi Rhodes [id number] Date: 01/24/2020  Mom reports good latch and frequent feedings as well as urine and stool output. States feeling less breast changes/fullness with this baby but hand expression or pumping following some daytime feedings after 4-5 days reviewed if weight drops or output decreases. Follow up lactation services after discharge reviewed.    Maternal Data Formula Feeding for Exclusion: No Has patient been taught Hand Expression?: Yes Does the patient have breastfeeding experience prior to this delivery?: Yes  Feeding Feeding Type: Breast Fed  LATCH Score                   Interventions    Lactation Tools Discussed/Used     Consult Status      Jodi Rhodes 01/24/2020, 12:04 PM

## 2020-01-24 NOTE — Progress Notes (Signed)
Progress Note - Vaginal Delivery  Jodi Rhodes is a 29 y.o. [phone] now PP day 1 s/p Vaginal, Spontaneous .   Subjective:  The patient reports no complaints, up ad lib, voiding, tolerating PO and + flatus  Objective:  Vital signs in last 24 hours: Temp:  [97.6 F (36.4 C)-98.4 F (36.9 C)] 98 F (36.7 C) (05/17 0500) Pulse Rate:  [61-75] 65 (05/17 0500) Resp:  [14-18] 18 (05/17 0500) BP: (115-134)/(74-94) 119/80 (05/17 0500) SpO2:  [99 %-100 %] 100 % (05/17 0500)  Physical Exam:  General: alert, cooperative, appears stated age, fatigued and no distress Lochia: appropriate Uterine Fundus: firm @u      Data Review Recent Labs    01/23/20 0635 01/24/20 0638  HGB 10.3* 8.9*  HCT 34.4* 28.5*    Assessment/Plan: Active Problems:   Labor and delivery, indication for care   Plan for discharge tomorrow   -- Continue routine PP care.     01/26/20, CNM  01/24/2020 8:05 AM

## 2020-01-24 NOTE — Anesthesia Postprocedure Evaluation (Signed)
Anesthesia Post Note  Patient: Jodi Rhodes  Procedure(s) Performed: AN AD HOC LABOR EPIDURAL  Patient location during evaluation: Mother Baby Anesthesia Type: Epidural Level of consciousness: awake and alert and oriented Pain management: pain level controlled Vital Signs Assessment: post-procedure vital signs reviewed and stable Respiratory status: respiratory function stable Cardiovascular status: stable Postop Assessment: no headache, no backache, no apparent nausea or vomiting, able to ambulate, adequate PO intake and patient able to bend at knees Anesthetic complications: no     Last Vitals:  Vitals:   01/24/20 0500 01/24/20 0833  BP: 119/80 (!) 140/91  Pulse: 65 (!) 55  Resp: 18   Temp: 36.7 C (!) 36.3 C  SpO2: 100% 100%    Last Pain:  Vitals:   01/24/20 0833  TempSrc: Oral  PainSc:                  Zachary George

## 2020-01-25 MED ORDER — IBUPROFEN 600 MG PO TABS: 600.0000 mg | ORAL_TABLET | Freq: Four times a day (QID) | ORAL | 0 refills | Status: DC

## 2020-01-25 MED ORDER — DOCUSATE SODIUM 100 MG PO CAPS: 100.0000 mg | ORAL_CAPSULE | Freq: Two times a day (BID) | ORAL | 0 refills | Status: DC

## 2020-01-25 NOTE — Discharge Instructions (Signed)

## 2020-01-25 NOTE — Progress Notes (Signed)
Patient discharged home with infant. Discharge instructions and prescriptions given and reviewed with patient. Patient verbalized understanding. Waiting for ride. Will be escorted out by auxillary.

## 2020-01-25 NOTE — Progress Notes (Signed)
Notified Doreene Burke, CNM of pt reporting a red place above upper lip (hx of HSV). She stated it started out feeling numb and itchy, even in the inside of her mouth. Red place is not open and has no drainage. Stated that it started after spraying Dermaplast in the bathroom earlier but unsure if that has anything to do with it.   Pt requests an order for allergy medicine as well.   Valtrex 1000mg  daily ordered to start, first dose taken at 2140.   Per 2141, pediatrician notified of possible HSV lesion.

## 2020-01-25 NOTE — Final Progress Note (Signed)
Discharge Day SOAP Note:  Progress Note - Vaginal Delivery  Jodi Rhodes is a 29 y.o. [phone] now PP day 2 s/p Vaginal, Spontaneous . Delivery was uncomplicated  Subjective  The patient has the following complaints: has no unusual complaints  Pain is controlled with current medications.   Patient is urinating without difficulty.  She is ambulating well.     Objective  Vital signs: BP 111/79 (BP Location: Left Arm)   Pulse 64   Temp (!) 97.3 F (36.3 C) (Oral)   Resp 18   Ht 5\' 4"  (1.626 m)   Wt 90.7 kg   LMP 04/23/2019   SpO2 98%   Breastfeeding Unknown   BMI 34.33 kg/m   Physical Exam: Gen: NAD Fundus Fundal Tone: Firm  Lochia Amount: Scant        Data Review Labs: Lab Results  Component Value Date   WBC 7.8 01/24/2020   HGB 8.9 (L) 01/24/2020   HCT 28.5 (L) 01/24/2020   MCV 70.5 (L) 01/24/2020   PLT 142 (L) 01/24/2020   CBC Latest Ref Rng & Units 01/24/2020 01/23/2020 12/30/2019  WBC 4.0 - 10.5 K/uL 7.8 6.8 6.4  Hemoglobin 12.0 - 15.0 g/dL 01/01/2020) 10.3(L) 8.7(L)  Hematocrit 36.0 - 46.0 % 28.5(L) 34.4(L) 28.5(L)  Platelets 150 - 400 K/uL 142(L) 195 154   A POS Performed at [facility], [address] [place], [place]   [id number] Score: Inocente Salles Postnatal Depression Scale Screening Tool 01/23/2020  I have been able to laugh and see the funny side of things. 0  I have looked forward with enjoyment to things. 0  I have blamed myself unnecessarily when things went wrong. 1  I have been anxious or worried for no good reason. 1  I have felt scared or panicky for no good reason. 0  Things have been getting on top of me. 0  I have been so unhappy that I have had difficulty sleeping. 0  I have felt sad or miserable. 0  I have been so unhappy that I have been crying. 0  The thought of harming myself has occurred to me. 0  Edinburgh Postnatal Depression Scale Total 2    Assessment/Plan  Active Problems:   Labor  and delivery, indication for care    Plan for discharge today.  Discharge Instructions: Per After Visit Summary. Activity: Advance as tolerated. Pelvic rest for 6 weeks.  Also refer to After Visit Summary Diet: Regular Medications: Allergies as of 01/25/2020      Reactions   Dimetapp Multisymptom Cold-flu [phenyleph-cpm-dm-apap] Rash      Medication List    STOP taking these medications   acetaminophen-codeine 300-30 MG tablet Commonly known as: TYLENOL #3     TAKE these medications   docusate sodium 100 MG capsule Commonly known as: COLACE Take 1 capsule (100 mg total) by mouth 2 (two) times daily.   FUSION PLUS PO Take by mouth.   ibuprofen 600 MG tablet Commonly known as: ADVIL Take 1 tablet (600 mg total) by mouth every 6 (six) hours.   loratadine 10 MG tablet Commonly known as: CLARITIN Take 1 tablet (10 mg total) by mouth daily as needed for allergies.   prenatal multivitamin Tabs tablet Take 1 tablet by mouth daily at 12 noon.   valACYclovir 1000 MG tablet Commonly known as: Valtrex Take 1 tablet (1,000 mg total) by mouth daily. What changed: Another medication with the same name was removed. Continue taking this medication, and follow the  directions you see here.      Outpatient follow up: 2 weeks tele visit, 6 weeks postpartum visit with Philip Aspen, CNM  Postpartum contraception: Plans IUD  Discharged Condition: good  Discharged to: home  Newborn Data: Disposition:home with mother  Apgars: APGAR (1 MIN): 8   APGAR (5 MINS): 9   APGAR (10 MINS):    Baby Feeding: Breast    Philip Aspen, CNM 01/25/2020 8:01 AM

## 2020-01-25 NOTE — Discharge Summary (Signed)
Patient Name: Jodi Rhodes DOB: July 24, 1991 MRN: [id number]                            Discharge Summary  Date of Admission: 01/23/2020 Date of Discharge: 01/25/2020 Delivering Provider: Philip Aspen   Admitting Diagnosis: Labor and delivery, indication for care [O75.9] at [redacted]w[redacted]d Secondary diagnosis:  Active Problems:   Labor and delivery, indication for care  Mode of Delivery: normal spontaneous vaginal delivery              Discharge diagnosis: Term Pregnancy Delivered      Intrapartum Procedures: epidural and GBS prophylaxis   Post partum procedures: none  Complications: none                     Discharge Day SOAP Note:  Progress Note - Vaginal Delivery  Jodi Rhodes is a 29 y.o. [id number] now PP day 2 s/p Vaginal, Spontaneous . Delivery was uncomplicated  Subjective  The patient has the following complaints: has no unusual complaints  Pain is controlled with current medications.   Patient is urinating without difficulty.  She is ambulating well.     Objective  Vital signs: BP 111/79 (BP Location: Left Arm)   Pulse 64   Temp (!) 97.3 F (36.3 C) (Oral)   Resp 18   Ht 5\' 4"  (1.626 m)   Wt 90.7 kg   LMP 04/23/2019   SpO2 98%   Breastfeeding Unknown   BMI 34.33 kg/m   Physical Exam: Gen: NAD Fundus Fundal Tone: Firm  Lochia Amount: Scant        Data Review Labs: Lab Results  Component Value Date   WBC 7.8 01/24/2020   HGB 8.9 (L) 01/24/2020   HCT 28.5 (L) 01/24/2020   MCV 70.5 (L) 01/24/2020   PLT 142 (L) 01/24/2020   CBC Latest Ref Rng & Units 01/24/2020 01/23/2020 12/30/2019  WBC 4.0 - 10.5 K/uL 7.8 6.8 6.4  Hemoglobin 12.0 - 15.0 g/dL 8.9(L) 10.3(L) 8.7(L)  Hematocrit 36.0 - 46.0 % 28.5(L) 34.4(L) 28.5(L)  Platelets 150 - 400 K/uL 142(L) 195 154   A POS Performed at [facility] [address] [place], [place] [address]   Flavia Shipper Score: Flavia Shipper Postnatal Depression Scale Screening Tool 01/23/2020  I have been  able to laugh and see the funny side of things. 0  I have looked forward with enjoyment to things. 0  I have blamed myself unnecessarily when things went wrong. 1  I have been anxious or worried for no good reason. 1  I have felt scared or panicky for no good reason. 0  Things have been getting on top of me. 0  I have been so unhappy that I have had difficulty sleeping. 0  I have felt sad or miserable. 0  I have been so unhappy that I have been crying. 0  The thought of harming myself has occurred to me. 0  Edinburgh Postnatal Depression Scale Total 2    Assessment/Plan  Active Problems:   Labor and delivery, indication for care    Plan for discharge today.  Discharge Instructions: Per After Visit Summary. Activity: Advance as tolerated. Pelvic rest for 6 weeks.  Also refer to After Visit Summary Diet: Regular Medications: Allergies as of 01/25/2020      Reactions   Dimetapp Multisymptom Cold-flu [phenyleph-cpm-dm-apap] Rash      Medication List    STOP taking these  medications   acetaminophen-codeine 300-30 MG tablet Commonly known as: TYLENOL #3     TAKE these medications   docusate sodium 100 MG capsule Commonly known as: COLACE Take 1 capsule (100 mg total) by mouth 2 (two) times daily.   FUSION PLUS PO Take by mouth.   ibuprofen 600 MG tablet Commonly known as: ADVIL Take 1 tablet (600 mg total) by mouth every 6 (six) hours.   loratadine 10 MG tablet Commonly known as: CLARITIN Take 1 tablet (10 mg total) by mouth daily as needed for allergies.   prenatal multivitamin Tabs tablet Take 1 tablet by mouth daily at 12 noon.   valACYclovir 1000 MG tablet Commonly known as: Valtrex Take 1 tablet (1,000 mg total) by mouth daily. What changed: Another medication with the same name was removed. Continue taking this medication, and follow the directions you see here.      Outpatient follow up: 2 weeks tele visit, 6 weeks postpartum visit with Doreene Burke,  CNM  Postpartum contraception: Plans IUD  Discharged Condition: good  Discharged to: home  Newborn Data: Disposition:home with mother  Apgars: APGAR (1 MIN): 8   APGAR (5 MINS): 9   APGAR (10 MINS):    Baby Feeding: Breast    Doreene Burke, CNM 01/25/2020 8:01 AM

## 2020-02-01 ENCOUNTER — Ambulatory Visit (INDEPENDENT_AMBULATORY_CARE_PROVIDER_SITE_OTHER): Payer: Medicaid Other | Admitting: Certified Nurse Midwife

## 2020-02-01 ENCOUNTER — Other Ambulatory Visit: Payer: Self-pay

## 2020-02-01 ENCOUNTER — Encounter: Payer: Self-pay | Admitting: Certified Nurse Midwife

## 2020-02-01 VITALS — BP 120/80 | HR 74 | Temp 98.4°F | Ht 64.0 in | Wt 185.4 lb

## 2020-02-01 DIAGNOSIS — O9229 Other disorders of breast associated with pregnancy and the puerperium: Secondary | ICD-10-CM

## 2020-02-01 MED ORDER — DICLOXACILLIN SODIUM 500 MG PO CAPS
500.0000 mg | ORAL_CAPSULE | Freq: Four times a day (QID) | ORAL | 0 refills | Status: AC
Start: 2020-02-01 — End: 2020-02-08

## 2020-02-01 NOTE — Patient Instructions (Signed)
Mastitis  Mastitis is irritation and swelling (inflammation) in an area of the breast. It is often caused by an infection that occurs when germs (bacteria) enter the skin. This most often happens to breastfeeding mothers, but it can happen to other women too as well as some men. Follow these instructions at home: Medicines  Take over-the-counter and prescription medicines only as told by your doctor.  If you were prescribed an antibiotic medicine, take it as told by your doctor. Do not stop taking it even if you start to feel better. General instructions  Do not wear a tight or underwire bra. Wear a soft support bra.  Drink more fluids, especially if you have a fever.  Get plenty of rest. If you are breastfeeding:   Keep emptying your breasts by breastfeeding or by using a breast pump.  Keep your nipples clean and dry.  During breastfeeding, empty the first breast before going to the other breast. Use a breast pump if your baby is not emptying your breasts.  Massage your breasts during feeding or pumping as told by your doctor.  If told, put moist heat on the affected area of your breast right before breastfeeding or pumping. Use the heat source that your doctor tells you to use.  If told, put ice on the affected area of your breast right after breastfeeding or pumping: ? Put ice in a plastic bag. ? Place a towel between your skin and the bag. ? Leave the ice on for 20 minutes.  If you go back to work, pump your breasts while at work.  Avoid letting your breasts get overly filled with milk (engorged). Contact a doctor if:  You have pus-like fluid leaking from your breast.  You have a fever.  Your symptoms do not get better within 2 days. Get help right away if:  Your pain and swelling are getting worse.  Your pain is not helped by medicine.  You have a red line going from your breast toward your armpit. Summary  Mastitis is irritation and swelling in an area of  the breast.  If you were prescribed an antibiotic medicine, do not stop taking it even if you start to feel better.  Drink more fluids and get plenty of rest.  Contact a doctor if your symptoms do not get better within 2 days. This information is not intended to replace advice given to you by your health care provider. Make sure you discuss any questions you have with your health care provider. Document Revised: 08/08/2017 Document Reviewed: 09/17/2016 Elsevier Patient Education  2020 [employer]  

## 2020-02-01 NOTE — Progress Notes (Signed)
GYN ENCOUNTER NOTE  Subjective:       Jodi Rhodes is a 29 y.o. [phone] female 1 wk post delivery here for evaluation of the following issues:  1. Mastitis, painful stinging pain with latch, especially noted on the left breast. She states she has not actually checked her temperature but that she is sweaty and achy.    Obstetric History OB History  Gravida Para Term Preterm AB Living  4 3 3     2   SAB TAB Ectopic Multiple Live Births          2    # Outcome Date GA Lbr Len/2nd Weight Sex Delivery Anes PTL Lv  4 Gravida           3 Term 07/17/18   7 lb 6 oz (3.345 kg) F Vag-Spont  N   2 Term 06/12/16   7 lb 6 oz (3.345 kg) F Vag-Spont  N LIV  1 Term 07/06/12   6 lb 4 oz (2.835 kg) F Vag-Spont  N LIV    Past Medical History:  Diagnosis Date  . Anemia   . Herpes 2018  . 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 NO PAST SURGERIES      Current Outpatient Medications on File Prior to Visit  Medication Sig Dispense Refill  . Iron-FA-B Cmp-C-Biot-Probiotic (FUSION PLUS PO) Take by mouth.    . Prenatal Vit-Fe Fumarate-FA (PRENATAL MULTIVITAMIN) TABS tablet Take 1 tablet by mouth daily at 12 noon.    . docusate sodium (COLACE) 100 MG capsule Take 1 capsule (100 mg total) by mouth 2 (two) times daily. (Patient not taking: Reported on 02/01/2020) 10 capsule 0  . ibuprofen (ADVIL) 600 MG tablet Take 1 tablet (600 mg total) by mouth every 6 (six) hours. (Patient not taking: Reported on 02/01/2020) 30 tablet 0  . loratadine (CLARITIN) 10 MG tablet Take 1 tablet (10 mg total) by mouth daily as needed for allergies. (Patient not taking: Reported on 12/30/2019) 30 tablet 0  . valACYclovir (VALTREX) 1000 MG tablet Take 1 tablet (1,000 mg total) by mouth daily. (Patient not taking: Reported on 02/01/2020) 30 tablet 1   No current facility-administered medications on file prior to visit.    Allergies  Allergen Reactions  .  Dimetapp Multisymptom Cold-Flu [Phenyleph-Cpm-Dm-Apap] Rash    Social History   Socioeconomic History  . Marital status: 02/03/2020    Spouse name: Media planner  . Number of children: Not on file  . Years of education: Not on file  . Highest education level: Not on file  Occupational History  . Not on file  Tobacco Use  . Smoking status: Former Smoker    Types: Cigarettes  . Smokeless tobacco: Never Used  Substance and Sexual Activity  . Alcohol use: Not Currently  . Drug use: Not Currently  . Sexual activity: Yes    Birth control/protection: I.U.D.  Other Topics Concern  . Not on file  Social History Narrative  . Not on file   Social Determinants of Health   Financial Resource Strain:   . Difficulty of Paying Living Expenses:   Food Insecurity:   . Worried About Sherilyn Cooter in the Last Year:   . Programme researcher, broadcasting/film/video in the Last Year:   Transportation Needs:   . Barista (Medical):   Freight forwarder Lack of Transportation (Non-Medical):   Physical Activity:   . Days  of Exercise per Week:   . Minutes of Exercise per Session:   Stress:   . Feeling of Stress :   Social Connections:   . Frequency of Communication with Friends and Family:   . Frequency of Social Gatherings with Friends and Family:   . Attends Religious Services:   . Active Member of Clubs or Organizations:   . Attends Archivist Meetings:   Marland Kitchen Marital Status:   Intimate Partner Violence:   . Fear of Current or Ex-Partner:   . Emotionally Abused:   Marland Kitchen Physically Abused:   . Sexually Abused:     No family history on file.  The following portions of the patient's history were reviewed and updated as appropriate: allergies, current medications, past family history, past medical history, past social history, past surgical history and problem list.  Review of Systems Review of Systems - Negative except as mentioned in HPI Review of Systems - General ROS: negative for -  malaise or night  sweats. Positive for fatigue, hot flashes, chills  Hematological and Lymphatic ROS: negative for - bleeding problems or swollen lymph nodes Gastrointestinal ROS: negative for - abdominal pain, blood in stools, change in bowel habits and nausea/vomiting Musculoskeletal ROS: negative for - joint pain, muscle pain or muscular weakness. Positive for body aches  Objective:   Temp 98.4 F (36.9 C)   Ht 5\' 4"  (1.626 m)   Wt 185 lb 7 oz (84.1 kg)   BMI 31.83 kg/m  CONSTITUTIONAL: Well-developed, well-nourished female in no acute distress.  HENT:  Normocephalic, atraumatic.  NECK: Normal range of motion, supple, no masses.  Normal thyroid.  SKIN: Skin is warm and dry. No rash noted. Not diaphoretic. No erythema. No pallor. [place]: Alert and oriented to person, place, and time. PSYCHIATRIC: Normal mood and affect. Normal behavior. Normal judgment and thought content. CARDIOVASCULAR:Not Examined RESPIRATORY: Not Examined BREASTS: No redness noted or plugged ducts identified on exam. Tenderness on exam of left breast at 6 o'clock.No cracking or bleeding of nipples.  ABDOMEN: Soft, non distended; Non tender.  No Organomegaly. PELVIC: MUSCULOSKELETAL: Normal range of motion. No tenderness.  No cyanosis, clubbing, or edema.     Assessment:   Mastitis   Plan:   Antibiotics ordered with instructions to nurse/pump frequently, motrin for pain. Warm compress to effected area. Orders placed for consult to lactation to observe fetal latch. Follow up as scheduled  Or prn .   Philip Aspen, CNM

## 2020-02-08 ENCOUNTER — Ambulatory Visit (INDEPENDENT_AMBULATORY_CARE_PROVIDER_SITE_OTHER): Payer: Medicaid Other | Admitting: Certified Nurse Midwife

## 2020-02-08 ENCOUNTER — Encounter: Payer: Self-pay | Admitting: Certified Nurse Midwife

## 2020-02-08 ENCOUNTER — Other Ambulatory Visit: Payer: Self-pay

## 2020-02-08 ENCOUNTER — Telehealth: Payer: Self-pay

## 2020-02-08 VITALS — BP 122/87 | HR 68 | Ht 64.0 in | Wt 181.6 lb

## 2020-02-08 DIAGNOSIS — Z1331 Encounter for screening for depression: Secondary | ICD-10-CM

## 2020-02-08 NOTE — Telephone Encounter (Signed)
Voicemail message left for patient to return my call for her 215 scheduled televisit.

## 2020-02-08 NOTE — Patient Instructions (Signed)

## 2020-02-08 NOTE — Progress Notes (Signed)
Subjective:    Jodi Rhodes is a 29 y.o. G9P3002 Caucasian female who presents for a postpartum visit. She is 2 weeks postpartum following a spontaneous vaginal delivery.  Anesthesia: epidural. I have fully reviewed the prenatal and intrapartum course. Jodi Rhodes is feeding by breast. . Postpartum depression screening: negative. Score 1.    Sleeping 4-5 hrs at a time baby wakes once at night to eat Eating: regularly with snacks Support : Husband and mother in law.   The following portions of the patient's history were reviewed and updated as appropriate: allergies, current medications, past medical history, past surgical history and problem list.  Review of Systems Pertinent items are noted in HPI.   Vitals:   02/08/20 1425  BP: 122/87  Pulse: 68  Weight: 181 lb 9 oz (82.4 kg)  Height: 5\' 4"  (1.626 m)   No LMP recorded.   Assessment:   Mood screening  2 wks s/p SVE Breast*feeding Depression screening   Plan:   Follow up in: 4 weeks for PPV or earlier if needed  ,, CNM

## 2020-03-07 ENCOUNTER — Ambulatory Visit (INDEPENDENT_AMBULATORY_CARE_PROVIDER_SITE_OTHER): Payer: Medicaid Other | Admitting: Certified Nurse Midwife

## 2020-03-07 ENCOUNTER — Other Ambulatory Visit: Payer: Self-pay

## 2020-03-07 ENCOUNTER — Encounter: Payer: Self-pay | Admitting: Certified Nurse Midwife

## 2020-03-07 ENCOUNTER — Other Ambulatory Visit (HOSPITAL_COMMUNITY)
Admission: RE | Admit: 2020-03-07 | Discharge: 2020-03-07 | Disposition: A | Discharge status: Home / Self Care | Payer: Medicaid Other | Source: Ambulatory Visit | Attending: Certified Nurse Midwife | Admitting: Certified Nurse Midwife

## 2020-03-07 DIAGNOSIS — N898 Other specified noninflammatory disorders of vagina: Secondary | ICD-10-CM | POA: Insufficient documentation

## 2020-03-07 NOTE — Progress Notes (Signed)
Subjective:    Jodi Rhodes is a 29 y.o. G21P3002 Caucasian female who presents for a postpartum visit. She is 6 weeks postpartum following a spontaneous vaginal delivery at 39.2 gestational weeks. Anesthesia: epidural. I have fully reviewed the prenatal and intrapartum course. Postpartum course has been WNL. Baby's course has been normal. Baby is feeding by breast. Bleeding no bleeding. Bowel function is normal. Bladder function is normal. Patient is not sexually active. . Contraception method is IUD. Postpartum depression screening: negative. Score 2.  Last pap 07/2019 and was negative  The following portions of the patient's history were reviewed and updated as appropriate: allergies, current medications, past medical history, past surgical history and problem list.  Review of Systems Pertinent items are noted in HPI.   There were no vitals filed for this visit. No LMP recorded.  Objective:   General:  alert, cooperative and no distress   Breasts:  deferred, no complaints  Lungs: clear to auscultation bilaterally  Heart:  regular rate and rhythm  Abdomen: soft, nontender   Vulva: normal  Vagina: normal vagina  Cervix:  closed  Corpus: Well-involuted  Adnexa:  Non-palpable  Rectal Exam: no hemorrhoids        Assessment:   Postpartum exam 6 wks s/p SVD Breastfeeding/pumping Depression screening Contraception counseling   Plan:  : plans- IUD Mirena, encouraged use of motrin prior to visit.  Vaginal swab collected today for discharge with odor Follow up in: PRN  for IUD  or earlier if needed  Doreene Burke CNM

## 2020-03-07 NOTE — Patient Instructions (Signed)

## 2020-03-09 LAB — CERVICOVAGINAL ANCILLARY ONLY
Bacterial Vaginitis (gardnerella): POSITIVE — AB
Candida Glabrata: NEGATIVE
Candida Vaginitis: NEGATIVE
Comment: NEGATIVE
Comment: NEGATIVE
Comment: NEGATIVE

## 2020-03-14 ENCOUNTER — Other Ambulatory Visit: Payer: Self-pay | Admitting: Certified Nurse Midwife

## 2020-03-14 ENCOUNTER — Ambulatory Visit: Payer: Medicaid Other | Admitting: Certified Nurse Midwife

## 2020-03-14 MED ORDER — METRONIDAZOLE 500 MG PO TABS: 500.0000 mg | ORAL_TABLET | Freq: Two times a day (BID) | ORAL | 0 refills | Status: AC

## 2020-03-14 NOTE — Progress Notes (Signed)
Swab positive for BV, orders placed for treatment.   Jodi Rhodes, CNM  

## 2020-03-20 ENCOUNTER — Encounter: Payer: Self-pay | Admitting: Certified Nurse Midwife

## 2020-03-20 ENCOUNTER — Ambulatory Visit (INDEPENDENT_AMBULATORY_CARE_PROVIDER_SITE_OTHER): Payer: Medicaid Other | Admitting: Certified Nurse Midwife

## 2020-03-20 ENCOUNTER — Other Ambulatory Visit: Payer: Self-pay

## 2020-03-20 VITALS — Ht 64.0 in | Wt 184.3 lb

## 2020-03-20 DIAGNOSIS — Z30430 Encounter for insertion of intrauterine contraceptive device: Secondary | ICD-10-CM | POA: Diagnosis not present

## 2020-03-20 LAB — POCT URINE PREGNANCY: Preg Test, Ur: NEGATIVE

## 2020-03-20 NOTE — Patient Instructions (Signed)
Intrauterine Device Insertion, Care After  This sheet gives you information about how to care for yourself after your procedure. Your health care provider may also give you more specific instructions. If you have problems or questions, contact your health care provider. What can I expect after the procedure? After the procedure, it is common to have:  Cramps and pain in the abdomen.  Light bleeding (spotting) or heavier bleeding that is like your menstrual period. This may last for up to a few days.  Lower back pain.  Dizziness.  Headaches.  Nausea. Follow these instructions at home:  Before resuming sexual activity, check to make sure that you can feel the IUD string(s). You should be able to feel the end of the string(s) below the opening of your cervix. If your IUD string is in place, you may resume sexual activity. ? If you had a hormonal IUD inserted more than 7 days after your most recent period started, you will need to use a backup method of birth control for 7 days after IUD insertion. Ask your health care provider whether this applies to you.  Continue to check that the IUD is still in place by feeling for the string(s) after every menstrual period, or once a month.  Take over-the-counter and prescription medicines only as told by your health care provider.  Do not drive or use heavy machinery while taking prescription pain medicine.  Keep all follow-up visits as told by your health care provider. This is important. Contact a health care provider if:  You have bleeding that is heavier or lasts longer than a normal menstrual cycle.  You have a fever.  You have cramps or abdominal pain that get worse or do not get better with medicine.  You develop abdominal pain that is new or is not in the same area of earlier cramping and pain.  You feel lightheaded or weak.  You have abnormal or bad-smelling discharge from your vagina.  You have pain during sexual  activity.  You have any of the following problems with your IUD string(s): ? The string bothers or hurts you or your sexual partner. ? You cannot feel the string. ? The string has gotten longer.  You can feel the IUD in your vagina.  You think you may be pregnant, or you miss your menstrual period.  You think you may have an STI (sexually transmitted infection). Get help right away if:  You have flu-like symptoms.  You have a fever and chills.  You can feel that your IUD has slipped out of place. Summary  After the procedure, it is common to have cramps and pain in the abdomen. It is also common to have light bleeding (spotting) or heavier bleeding that is like your menstrual period.  Continue to check that the IUD is still in place by feeling for the string(s) after every menstrual period, or once a month.  Keep all follow-up visits as told by your health care provider. This is important.  Contact your health care provider if you have problems with your IUD string(s), such as the string getting longer or bothering you or your sexual partner. This information is not intended to replace advice given to you by your health care provider. Make sure you discuss any questions you have with your health care provider. Document Revised: 08/08/2017 Document Reviewed: 07/17/2016 Elsevier Patient Education  2020 Elsevier Inc.  

## 2020-03-20 NOTE — Progress Notes (Signed)
   GYNECOLOGY OFFICE PROCEDURE NOTE  Jodi Rhodes is a 29 y.o. [phone] here for Mirena IUD insertion. No GYN concerns.  Last pap smear was on  07/2019 and was normal.  IUD Insertion Procedure Note Patient identified, informed consent performed, consent signed.   Discussed risks of irregular bleeding, cramping, infection, malpositioning or misplacement of the IUD outside the uterus which may require further procedure such as laparoscopy. Also discussed >99% contraception efficacy, increased risk of ectopic pregnancy with failure of method.  Time out was performed.  Urine pregnancy test negative.  Speculum placed in the vagina.  Cervix visualized.  Cleaned with Betadine x 2.  Grasped anteriorly with a single tooth tenaculum.  Uterus sounded to 8 cm.  Mirena IUD placed per manufacturer's recommendations.  Strings trimmed to 3 cm. Tenaculum was removed, good hemostasis noted.  Patient tolerated procedure well.   Patient was given post-procedure instructions.  She was advised to have backup contraception for one week.  Patient was also asked to check IUD strings periodically and follow up in 4 weeks for IUD check.   Doreene Burke, CNM

## 2020-04-17 ENCOUNTER — Encounter: Payer: Medicaid Other | Admitting: Certified Nurse Midwife

## 2020-05-03 ENCOUNTER — Other Ambulatory Visit: Payer: Self-pay

## 2020-05-03 ENCOUNTER — Other Ambulatory Visit: Payer: Medicaid Other

## 2020-05-03 DIAGNOSIS — Z20822 Contact with and (suspected) exposure to covid-19: Secondary | ICD-10-CM

## 2020-05-05 LAB — SARS-COV-2, NAA 2 DAY TAT

## 2020-05-05 LAB — NOVEL CORONAVIRUS, NAA: SARS-CoV-2, NAA: NOT DETECTED

## 2020-05-10 ENCOUNTER — Ambulatory Visit (INDEPENDENT_AMBULATORY_CARE_PROVIDER_SITE_OTHER): Payer: Medicaid Other | Admitting: Certified Nurse Midwife

## 2020-05-10 ENCOUNTER — Encounter: Payer: Self-pay | Admitting: Certified Nurse Midwife

## 2020-05-10 ENCOUNTER — Other Ambulatory Visit: Payer: Self-pay

## 2020-05-10 VITALS — BP 117/88 | HR 72 | Ht 64.0 in | Wt 186.4 lb

## 2020-05-10 DIAGNOSIS — Z30431 Encounter for routine checking of intrauterine contraceptive device: Secondary | ICD-10-CM | POA: Diagnosis not present

## 2020-05-10 NOTE — Patient Instructions (Signed)

## 2020-05-10 NOTE — Progress Notes (Signed)
    GYNECOLOGY OFFICE ENCOUNTER NOTE  History:  29 y.o. [id number] here today for today for IUD string check; Mirena  IUD was placed  03/20/20 No complaints about the IUD, no concerning side effects.  The following portions of the patient's history were reviewed and updated as appropriate: allergies, current medications, past family history, past medical history, past social history, past surgical history and problem list. Last pap smear on 07/2019 was normal, negative.  Review of Systems:  Pertinent items are noted in HPI.   Objective:  Physical Exam currently breastfeeding. CONSTITUTIONAL: Well-developed, well-nourished female in no acute distress.  HENT:  Normocephalic, atraumatic. External right and left ear normal. Oropharynx is clear and moist EYES: Conjunctivae and EOM are normal. Pupils are equal, round, and reactive to light. No scleral icterus.  NECK: Normal range of motion, supple, no masses CARDIOVASCULAR: Normal heart rate noted RESPIRATORY: Effort and breath sounds normal, no problems with respiration noted ABDOMEN: Soft, no distention noted.   PELVIC: Normal appearing external genitalia; normal appearing vaginal mucosa and cervix.  IUD strings visualized, about 1  cm in length outside cervix.   Assessment & Plan:  Patient to keep IUD in place for up to five years; can come in for removal if she desires pregnancy earlier or for any concerning side effects.   Doreene Burke, CNM

## 2020-06-01 ENCOUNTER — Other Ambulatory Visit: Payer: Self-pay

## 2020-06-01 DIAGNOSIS — Z20822 Contact with and (suspected) exposure to covid-19: Secondary | ICD-10-CM

## 2020-06-03 LAB — SARS-COV-2, NAA 2 DAY TAT

## 2020-06-03 LAB — NOVEL CORONAVIRUS, NAA: SARS-CoV-2, NAA: NOT DETECTED

## 2020-06-22 IMAGING — DX DG CHEST 1V PORT
1 series · 1 of 1 positions shown · non-contrast
Comparison: None.

CLINICAL DATA: Cough, shortness of breath, [id number] exposure

EXAM:
PORTABLE CHEST 1 VIEW

[chest ap]
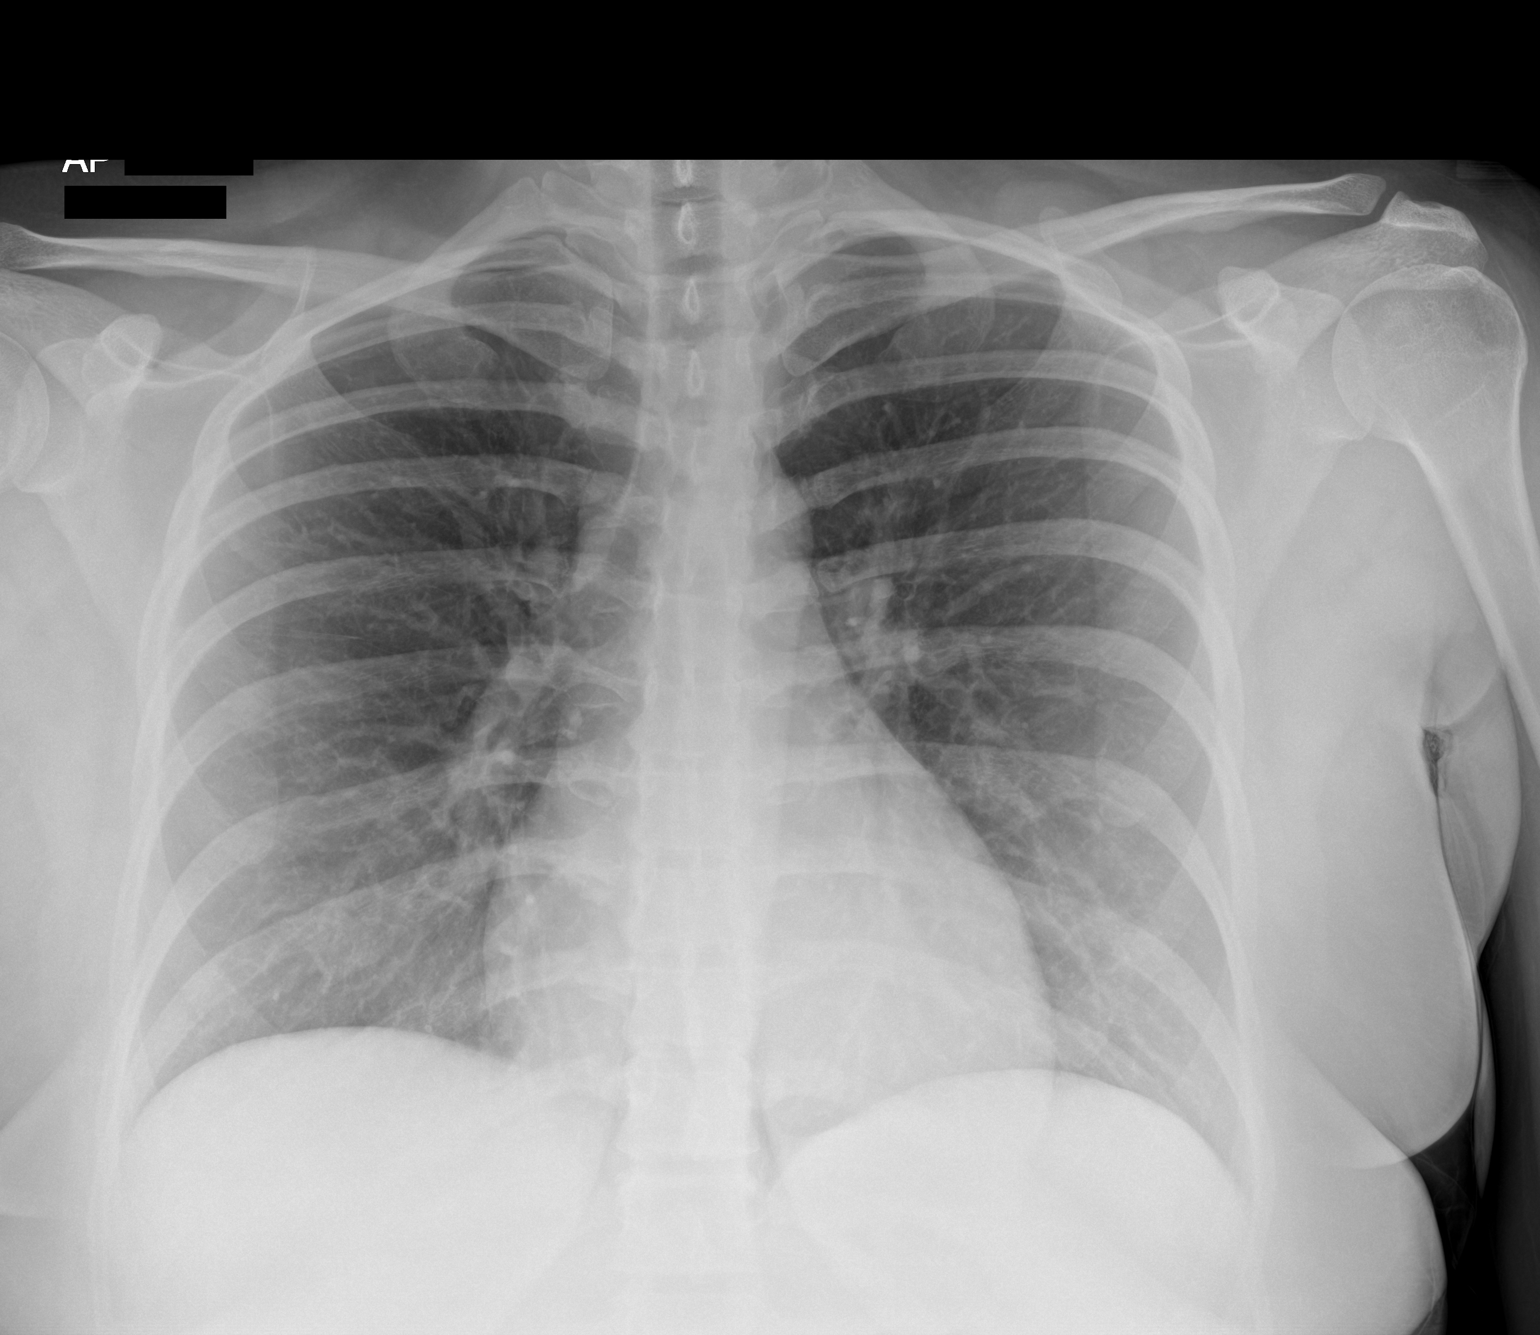

[1 of 1 positions shown; findings below may reference images not displayed]

FINDINGS: The heart size and mediastinal contours are within normal limits.
Both lungs are clear. The visualized skeletal structures are
unremarkable.
IMPRESSION: No acute abnormality of the lungs in AP portable examination.

## 2021-01-09 ENCOUNTER — Encounter: Payer: Self-pay | Admitting: Certified Nurse Midwife

## 2021-01-09 ENCOUNTER — Other Ambulatory Visit: Payer: Self-pay

## 2021-01-09 ENCOUNTER — Other Ambulatory Visit (HOSPITAL_COMMUNITY)
Admission: RE | Admit: 2021-01-09 | Discharge: 2021-01-09 | Disposition: A | Discharge status: Home / Self Care | Payer: Medicaid Other | Source: Ambulatory Visit | Attending: Certified Nurse Midwife | Admitting: Certified Nurse Midwife

## 2021-01-09 ENCOUNTER — Ambulatory Visit (INDEPENDENT_AMBULATORY_CARE_PROVIDER_SITE_OTHER): Payer: Medicaid Other | Admitting: Certified Nurse Midwife

## 2021-01-09 VITALS — BP 109/75 | HR 79 | Resp 16 | Ht 64.0 in | Wt 148.3 lb

## 2021-01-09 DIAGNOSIS — Z113 Encounter for screening for infections with a predominantly sexual mode of transmission: Secondary | ICD-10-CM | POA: Diagnosis not present

## 2021-01-09 DIAGNOSIS — N898 Other specified noninflammatory disorders of vagina: Secondary | ICD-10-CM | POA: Diagnosis not present

## 2021-01-09 NOTE — Progress Notes (Signed)
GYN ENCOUNTER NOTE  Subjective:       Jodi Rhodes is a 30 y.o. [phone] female is here for gynecologic evaluation of the following issues:  1. Requesting STD testing , state she is separating from her partner and wants to make sure she does not have infections. She has had vaginal irritation for the past several weeks.     Gynecologic History Patient's last menstrual period was 12/28/2020 (exact date). Contraception: IUD Last Pap: 07/19/2019. Results were: normal Last mammogram: n/a  Obstetric History OB History  Gravida Para Term Preterm AB Living  4 3 3     2   SAB IAB Ectopic Multiple Live Births          2    # Outcome Date GA Lbr Len/2nd Weight Sex Delivery Anes PTL Lv  4 Gravida           3 Term 07/17/18   7 lb 6 oz (3.345 kg) F Vag-Spont  N   2 Term 06/12/16   7 lb 6 oz (3.345 kg) F Vag-Spont  N LIV  1 Term 07/06/12   6 lb 4 oz (2.835 kg) F Vag-Spont  N LIV    Past Medical History:  Diagnosis Date  . Anemia   . Herpes 2018  . 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 NO PAST SURGERIES      Current Outpatient Medications on File Prior to Visit  Medication Sig Dispense Refill  . ADDERALL XR 20 MG 24 hr capsule Take 20 mg by mouth every morning.    2020 amphetamine-dextroamphetamine (ADDERALL) 15 MG tablet Take 1 tablet by mouth daily.    Marland Kitchen levonorgestrel (MIRENA) 20 MCG/24HR IUD 1 each by Intrauterine route once.    . loratadine (CLARITIN) 10 MG tablet Take 1 tablet (10 mg total) by mouth daily as needed for allergies. 30 tablet 0   No current facility-administered medications on file prior to visit.    Allergies  Allergen Reactions  . Dimetapp Multisymptom Cold-Flu [Phenyleph-Cpm-Dm-Apap] Rash    Social History   Socioeconomic History  . Marital status: Marland Kitchen    Spouse name: Media planner  . Number of children: Not on file  . Years of education: Not on file  . Highest education level:  Not on file  Occupational History  . Not on file  Tobacco Use  . Smoking status: Former Smoker    Types: Cigarettes  . Smokeless tobacco: Never Used  Vaping Use  . Vaping Use: Never used  Substance and Sexual Activity  . Alcohol use: Not Currently  . Drug use: Not Currently  . Sexual activity: Yes    Birth control/protection: I.U.D.  Other Topics Concern  . Not on file  Social History Narrative  . Not on file   Social Determinants of Health   Financial Resource Strain: Not on file  Food Insecurity: Not on file  Transportation Needs: Not on file  Physical Activity: Not on file  Stress: Not on file  Social Connections: Not on file  Intimate Partner Violence: Not on file    History reviewed. No pertinent family history.  The following portions of the patient's history were reviewed and updated as appropriate: allergies, current medications, past family history, past medical history, past social history, past surgical history and problem list.  Review of Systems Review of Systems - Negative except as mentioned in hpi Review of Systems - General ROS: negative  for - chills, fatigue, fever, hot flashes, malaise or night sweats Hematological and Lymphatic ROS: negative for - bleeding problems or swollen lymph nodes Gastrointestinal ROS: negative for - abdominal pain, blood in stools, change in bowel habits and nausea/vomiting Musculoskeletal ROS: negative for - joint pain, muscle pain or muscular weakness Genito-Urinary ROS: negative for - change in menstrual cycle, dysmenorrhea, dyspareunia, dysuria, genital discharge, genital ulcers, hematuria, incontinence, irregular/heavy menses, nocturia or pelvic pain. Positive for vaginal irritation.   Objective:   BP 109/75   Pulse 79   Resp 16   Ht 5\' 4"  (1.626 m)   Wt 148 lb 4.8 oz (67.3 kg)   LMP 12/28/2020 (Exact Date)   BMI 25.46 kg/m  CONSTITUTIONAL: Well-developed, well-nourished female in no acute distress.  HENT:   Normocephalic, atraumatic.  NECK: Normal range of motion, supple, no masses.  Normal thyroid.  SKIN: Skin is warm and dry. No rash noted. Not diaphoretic. No erythema. No pallor. NEUROLGIC: Alert and oriented to person, place, and time. PSYCHIATRIC: Normal mood and affect. Normal behavior. Normal judgment and thought content. CARDIOVASCULAR:Not Examined RESPIRATORY: Not Examined BREASTS: Not Examined ABDOMEN: Soft, non distended; Non tender.  No Organomegaly. PELVIC:self swab completed.  MUSCULOSKELETAL: Normal range of motion. No tenderness.  No cyanosis, clubbing, or edema.     Assessment:   STD screening     Plan:   Will follow up with blood work and swab results. Return for annual or prn.   12/30/2020, CNM

## 2021-01-09 NOTE — Patient Instructions (Signed)
Safe Sex Practicing safe sex means taking steps before and during sex to reduce your risk of:  Getting an STI (sexually transmitted infection).  Giving your partner an STI.  Unwanted or unplanned pregnancy. How to practice safe sex Ways you can practice safe sex  Limit your sexual partners to only one partner who is having sex with only you.  Avoid using alcohol and drugs before having sex. Alcohol and drugs can affect your judgment.  Before having sex with a new partner: ? Talk to your partner about past partners, past STIs, and drug use. ? Get screened for STIs and discuss the results with your partner. Ask your partner to get screened too.  Check your body regularly for sores, blisters, rashes, or unusual discharge. If you notice any of these problems, visit your health care provider.  Avoid sexual contact if you have symptoms of an infection or you are being treated for an STI.  While having sex, use a condom. Make sure to: ? Use a condom every time you have vaginal, oral, or anal sex. Both females and males should wear condoms during oral sex. ? Keep condoms in place from the beginning to the end of sexual activity. ? Use a latex condom, if possible. Latex condoms offer the best protection. ? Use only water-based lubricants with a condom. Using petroleum-based lubricants or oils will weaken the condom and increase the chance that it will break.   Ways your health care provider can help you practice safe sex  See your health care provider for regular screenings, exams, and tests for STIs.  Talk with your health care provider about what kind of birth control (contraception) is best for you.  Get vaccinated against hepatitis B and human papillomavirus (HPV).  If you are at risk of being infected with HIV (human immunodeficiency virus), talk with your health care provider about taking a prescription medicine to prevent HIV infection. You are at risk for HIV if you: ? Are a man  who has sex with other men. ? Are sexually active with more than one partner. ? Take drugs by injection. ? Have a sex partner who has HIV. ? Have unprotected sex. ? Have sex with someone who has sex with both men and women. ? Have had an STI.   Follow these instructions at home:  Take over-the-counter and prescription medicines only as told by your health care provider.  Keep all follow-up visits. This is important. Where to find more information  Centers for Disease Control and Prevention: www.cdc.gov  Planned Parenthood: www.plannedparenthood.org  Office on Women's Health: www.womenshealth.gov Summary  Practicing safe sex means taking steps before and during sex to reduce your risk getting an STI, giving your partner an STI, and having an unwanted or unplanned pregnancy.  Before having sex with a new partner, talk to your partner about past partners, past STIs, and drug use.  Use a condom every time you have vaginal, oral, or anal sex. Both females and males should wear condoms during oral sex.  Check your body regularly for sores, blisters, rashes, or unusual discharge. If you notice any of these problems, visit your health care provider.  See your health care provider for regular screenings, exams, and tests for STIs. This information is not intended to replace advice given to you by your health care provider. Make sure you discuss any questions you have with your health care provider. Document Revised: 01/31/2020 Document Reviewed: 01/31/2020 Elsevier Patient Education  2021 Elsevier Inc.  

## 2021-01-10 LAB — CERVICOVAGINAL ANCILLARY ONLY
Bacterial Vaginitis (gardnerella): NEGATIVE
Candida Glabrata: NEGATIVE
Candida Vaginitis: NEGATIVE
Chlamydia: NEGATIVE
Comment: NEGATIVE
Comment: NEGATIVE
Comment: NEGATIVE
Comment: NEGATIVE
Comment: NORMAL
Neisseria Gonorrhea: NEGATIVE

## 2021-01-10 LAB — HIV ANTIBODY (ROUTINE TESTING W REFLEX): HIV Screen 4th Generation wRfx: NONREACTIVE

## 2021-01-10 LAB — RPR: RPR Ser Ql: NONREACTIVE

## 2021-01-10 LAB — HEPATITIS C ANTIBODY: Hep C Virus Ab: <0.1 s/co ratio (ref 0.0–0.9)

## 2021-01-10 LAB — HEPATITIS B SURFACE ANTIGEN: Hepatitis B Surface Ag: NEGATIVE

## 2021-09-17 ENCOUNTER — Encounter: Payer: Self-pay | Admitting: Certified Nurse Midwife

## 2021-09-19 ENCOUNTER — Encounter: Payer: Self-pay | Admitting: Certified Nurse Midwife

## 2021-09-25 ENCOUNTER — Encounter: Payer: Medicaid Other | Admitting: Certified Nurse Midwife

## 2021-10-01 ENCOUNTER — Other Ambulatory Visit (HOSPITAL_COMMUNITY)
Admission: RE | Admit: 2021-10-01 | Discharge: 2021-10-01 | Disposition: A | Discharge status: Home / Self Care | Payer: Medicaid Other | Source: Ambulatory Visit | Attending: Certified Nurse Midwife | Admitting: Certified Nurse Midwife

## 2021-10-01 ENCOUNTER — Encounter: Payer: Self-pay | Admitting: Certified Nurse Midwife

## 2021-10-01 ENCOUNTER — Ambulatory Visit (INDEPENDENT_AMBULATORY_CARE_PROVIDER_SITE_OTHER): Payer: Medicaid Other | Admitting: Certified Nurse Midwife

## 2021-10-01 ENCOUNTER — Other Ambulatory Visit: Payer: Self-pay

## 2021-10-01 VITALS — BP 119/82 | HR 73 | Ht 64.0 in | Wt 165.7 lb

## 2021-10-01 DIAGNOSIS — N898 Other specified noninflammatory disorders of vagina: Secondary | ICD-10-CM | POA: Insufficient documentation

## 2021-10-01 DIAGNOSIS — R399 Unspecified symptoms and signs involving the genitourinary system: Secondary | ICD-10-CM | POA: Diagnosis not present

## 2021-10-01 LAB — POCT URINALYSIS DIPSTICK
Bilirubin, UA: NEGATIVE
Glucose, UA: NEGATIVE
Ketones, UA: NEGATIVE
Nitrite, UA: POSITIVE
Protein, UA: NEGATIVE
Spec Grav, UA: 1.02 (ref 1.010–1.025)
Urobilinogen, UA: 0.2 E.U./dL
pH, UA: 6 (ref 5.0–8.0)

## 2021-10-01 MED ORDER — NITROFURANTOIN MONOHYD MACRO 100 MG PO CAPS: 100.0000 mg | ORAL_CAPSULE | Freq: Two times a day (BID) | ORAL | 0 refills | Status: AC

## 2021-10-01 NOTE — Progress Notes (Signed)
GYN ENCOUNTER NOTE  Subjective:       Jodi Rhodes is a 31 y.o. [phone] female is here for gynecologic evaluation of the following issues:  1. Urinary frequency , odor. Pt has history of UTI and kidney infection 2 . Vaginal irritation , itching on and off for a few months.     Gynecologic History No LMP recorded (lmp unknown). (Menstrual status: IUD). Contraception: IUD Last Pap: 07/2019. Results were: normal/negative Last mammogram: n/a.   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     amphetamine-dextroamphetamine (ADDERALL) 15 MG tablet Take 1 tablet by mouth daily.     levonorgestrel (MIRENA) 20 MCG/24HR IUD 1 each by Intrauterine route once.     loratadine (CLARITIN) 10 MG tablet Take 1 tablet (10 mg total) by mouth daily as needed for allergies. 30 tablet 0   No current facility-administered medications on file prior to visit.    Allergies  Allergen Reactions   Dimetapp Multisymptom Cold-Flu [Phenyleph-Cpm-Dm-Apap] Rash    Social History   Socioeconomic History   Marital status: Soil scientist    Spouse name: Mallie Mussel   Number of children: Not on file   Years of education: Not on file   Highest education level: Not on file  Occupational History   Not on file   Tobacco Use   Smoking status: Former    Types: Cigarettes   Smokeless tobacco: Never  Vaping Use   Vaping Use: Never used  Substance and Sexual Activity   Alcohol use: Not Currently   Drug use: Not Currently   Sexual activity: Yes    Birth control/protection: I.U.D.  Other Topics Concern   Not on file  Social History Narrative   Not on file   Social Determinants of Health   Financial Resource Strain: Not on file  Food Insecurity: Not on file  Transportation Needs: Not on file  Physical Activity: Not on file  Stress: Not on file  Social Connections: Not on file  Intimate Partner Violence: Not on file    History reviewed. No pertinent family history.  The following portions of the patient's history were reviewed and updated as appropriate: allergies, current medications, past family history, past medical history, past social history, past surgical history and problem list.  Review of Systems Review of Systems - Negative except as mentioned in HPI Review of Systems - General ROS: negative for - chills, fatigue, fever, hot flashes,  malaise or night sweats Hematological and Lymphatic ROS: negative for - bleeding problems or swollen lymph nodes Gastrointestinal ROS: negative for - abdominal pain, blood in stools, change in bowel habits and nausea/vomiting Musculoskeletal ROS: negative for - joint pain, muscle pain or muscular weakness Genito-Urinary ROS: negative for - change in menstrual cycle, dysmenorrhea, dyspareunia, dysuria, genital discharge, genital ulcers, hematuria, incontinence, irregular/heavy menses, nocturia or pelvic pain. Positive for vaginal itching and irritation, Positive for urinary frequency and pressure, odor.   Objective:   BP 119/82    Pulse 73    Ht 5\' 4"  (1.626 m)    Wt 165 lb 11.2 oz (75.2 kg)    LMP  (LMP Unknown)    BMI 28.44 kg/m  CONSTITUTIONAL: Well-developed, well-nourished female in no acute distress.  HENT:  Normocephalic, atraumatic.  NECK:  Normal range of motion, supple, no masses.  Normal thyroid.  SKIN: Skin is warm and dry. No rash noted. Not diaphoretic. No erythema. No pallor. Williams: Alert and oriented to person, place, and time. PSYCHIATRIC: Normal mood and affect. Normal behavior. Normal judgment and thought content. CARDIOVASCULAR:Not Examined RESPIRATORY: Not Examined BREASTS: Not Examined ABDOMEN: Soft, non distended; Non tender.  No Organomegaly. PELVIC:  External Genitalia: Normal  BUS: Normal  Vagina: Normal  Cervix: Normal, blood  MUSCULOSKELETAL: Normal range of motion. No tenderness.  No cyanosis, clubbing, or edema.     Assessment:   1. Urinary symptom or sign - POCT urinalysis dipstick - Urine Culture   Urine dipstick shows positive for nitrates.     Plan:  Vaginal swab collected , ua positive for UTI, orders placed. Urine culture sent. Will follow up with results. Return for annual or PRN.   Philip Aspen, CNM

## 2021-10-01 NOTE — Patient Instructions (Signed)

## 2021-10-02 ENCOUNTER — Encounter: Payer: Self-pay | Admitting: Certified Nurse Midwife

## 2021-10-02 LAB — CERVICOVAGINAL ANCILLARY ONLY
Bacterial Vaginitis (gardnerella): NEGATIVE
Candida Glabrata: NEGATIVE
Candida Vaginitis: NEGATIVE
Chlamydia: NEGATIVE
Comment: NEGATIVE
Comment: NEGATIVE
Comment: NEGATIVE
Comment: NEGATIVE
Comment: NEGATIVE
Comment: NORMAL
Neisseria Gonorrhea: NEGATIVE
Trichomonas: NEGATIVE

## 2021-10-03 ENCOUNTER — Encounter: Payer: Self-pay | Admitting: Certified Nurse Midwife

## 2021-10-03 LAB — URINE CULTURE
